# Patient Record
Sex: Female | Born: 1950 | Race: White | Hispanic: No | Marital: Married | Smoking: Former smoker
Health system: Southern US, Community
[De-identification: ages and names within clinical notes are randomized; demographics above are authoritative.]

## PROBLEM LIST (undated history)

## (undated) DIAGNOSIS — I10 Essential (primary) hypertension: Secondary | ICD-10-CM

## (undated) DIAGNOSIS — B9789 Other viral agents as the cause of diseases classified elsewhere: Secondary | ICD-10-CM

## (undated) DIAGNOSIS — R002 Palpitations: Secondary | ICD-10-CM

## (undated) DIAGNOSIS — F17200 Nicotine dependence, unspecified, uncomplicated: Secondary | ICD-10-CM

## (undated) HISTORY — DX: Other viral agents as the cause of diseases classified elsewhere: B97.89

## (undated) HISTORY — PX: CHOLECYSTECTOMY: SHX55

## (undated) HISTORY — DX: Nicotine dependence, unspecified, uncomplicated: F17.200

## (undated) HISTORY — DX: Palpitations: R00.2

## (undated) HISTORY — DX: Essential (primary) hypertension: I10

## (undated) HISTORY — PX: ABDOMINAL HYSTERECTOMY: SHX81

## (undated) HISTORY — PX: POLYPECTOMY: SHX149

## (undated) HISTORY — PX: VEIN LIGATION AND STRIPPING: SHX2653

## (undated) HISTORY — PX: TUBAL LIGATION: SHX77

## (undated) HISTORY — PX: COLONOSCOPY: SHX174

---

## 2002-06-26 ENCOUNTER — Other Ambulatory Visit: Admission: RE | Admit: 2002-06-26 | Discharge: 2002-06-26 | Payer: Self-pay | Admitting: Internal Medicine

## 2003-11-05 ENCOUNTER — Emergency Department (HOSPITAL_COMMUNITY): Admission: EM | Admit: 2003-11-05 | Discharge: 2003-11-05 | Payer: Self-pay | Admitting: Emergency Medicine

## 2004-01-01 ENCOUNTER — Encounter (INDEPENDENT_AMBULATORY_CARE_PROVIDER_SITE_OTHER): Payer: Self-pay | Admitting: Specialist

## 2004-01-01 ENCOUNTER — Ambulatory Visit (HOSPITAL_COMMUNITY): Admission: RE | Admit: 2004-01-01 | Discharge: 2004-01-01 | Payer: Self-pay | Admitting: Surgery

## 2005-05-25 ENCOUNTER — Ambulatory Visit: Payer: Self-pay | Admitting: Internal Medicine

## 2005-06-14 ENCOUNTER — Ambulatory Visit: Payer: Self-pay | Admitting: Internal Medicine

## 2006-06-02 ENCOUNTER — Ambulatory Visit: Payer: Self-pay | Admitting: Internal Medicine

## 2006-06-21 ENCOUNTER — Ambulatory Visit: Payer: Self-pay | Admitting: Internal Medicine

## 2007-03-01 LAB — HM COLONOSCOPY

## 2007-08-21 ENCOUNTER — Ambulatory Visit: Payer: Self-pay | Admitting: Family Medicine

## 2008-02-26 DIAGNOSIS — I10 Essential (primary) hypertension: Secondary | ICD-10-CM

## 2008-02-26 DIAGNOSIS — Z87891 Personal history of nicotine dependence: Secondary | ICD-10-CM

## 2008-02-27 ENCOUNTER — Ambulatory Visit: Payer: Self-pay | Admitting: Family Medicine

## 2008-03-04 ENCOUNTER — Ambulatory Visit: Payer: Self-pay | Admitting: Family Medicine

## 2008-03-10 DIAGNOSIS — E785 Hyperlipidemia, unspecified: Secondary | ICD-10-CM

## 2008-03-10 LAB — CONVERTED CEMR LAB
ALT: 11 units/L (ref 0–35)
AST: 21 units/L (ref 0–37)
Albumin: 4.1 g/dL (ref 3.5–5.2)
Alkaline Phosphatase: 70 units/L (ref 39–117)
BUN: 8 mg/dL (ref 6–23)
Bilirubin, Direct: 0.1 mg/dL (ref 0.0–0.3)
CO2: 31 meq/L (ref 19–32)
Calcium: 10 mg/dL (ref 8.4–10.5)
Chloride: 99 meq/L (ref 96–112)
Cholesterol: 233 mg/dL (ref 0–200)
Creatinine, Ser: 1 mg/dL (ref 0.4–1.2)
Direct LDL: 162.1 mg/dL
GFR calc Af Amer: 73 mL/min
GFR calc non Af Amer: 60 mL/min
Glucose, Bld: 96 mg/dL (ref 70–99)
HDL: 32.6 mg/dL — ABNORMAL LOW (ref >39.0)
Potassium: 3.6 meq/L (ref 3.5–5.1)
Sodium: 137 meq/L (ref 135–145)
Total Bilirubin: 1.1 mg/dL (ref 0.3–1.2)
Total CHOL/HDL Ratio: 7.1
Total Protein: 7.1 g/dL (ref 6.0–8.3)
Triglycerides: 191 mg/dL — ABNORMAL HIGH (ref 0–149)
VLDL: 38 mg/dL (ref 0–40)

## 2008-05-01 ENCOUNTER — Encounter: Payer: Self-pay | Admitting: Family Medicine

## 2008-05-01 ENCOUNTER — Ambulatory Visit: Payer: Self-pay | Admitting: Family Medicine

## 2008-05-02 ENCOUNTER — Encounter (INDEPENDENT_AMBULATORY_CARE_PROVIDER_SITE_OTHER): Payer: Self-pay | Admitting: *Deleted

## 2008-07-01 ENCOUNTER — Ambulatory Visit: Payer: Self-pay | Admitting: Family Medicine

## 2008-07-04 LAB — CONVERTED CEMR LAB
Cholesterol: 199 mg/dL (ref 0–200)
Direct LDL: 124.2 mg/dL
HDL: 28.3 mg/dL — ABNORMAL LOW (ref >39.0)
Total CHOL/HDL Ratio: 7
Triglycerides: 242 mg/dL (ref 0–149)
VLDL: 48 mg/dL — ABNORMAL HIGH (ref 0–40)

## 2009-01-05 ENCOUNTER — Encounter (INDEPENDENT_AMBULATORY_CARE_PROVIDER_SITE_OTHER): Payer: Self-pay | Admitting: *Deleted

## 2009-05-27 ENCOUNTER — Ambulatory Visit: Payer: Self-pay | Admitting: Family Medicine

## 2009-05-27 LAB — CONVERTED CEMR LAB
Cholesterol, target level: 200 mg/dL
HDL goal, serum: 40 mg/dL
LDL Goal: 130 mg/dL

## 2009-05-29 ENCOUNTER — Encounter (INDEPENDENT_AMBULATORY_CARE_PROVIDER_SITE_OTHER): Payer: Self-pay | Admitting: *Deleted

## 2009-05-29 LAB — CONVERTED CEMR LAB
ALT: 12 units/L (ref 0–35)
AST: 21 units/L (ref 0–37)
Albumin: 4.1 g/dL (ref 3.5–5.2)
Alkaline Phosphatase: 65 units/L (ref 39–117)
BUN: 13 mg/dL (ref 6–23)
Bilirubin, Direct: 0.1 mg/dL (ref 0.0–0.3)
CO2: 32 meq/L (ref 19–32)
Calcium: 9.7 mg/dL (ref 8.4–10.5)
Chloride: 105 meq/L (ref 96–112)
Cholesterol: 204 mg/dL — ABNORMAL HIGH (ref 0–200)
Creatinine, Ser: 0.8 mg/dL (ref 0.4–1.2)
Direct LDL: 149.1 mg/dL
GFR calc non Af Amer: 77.66 mL/min (ref >60)
Glucose, Bld: 93 mg/dL (ref 70–99)
HDL: 34.3 mg/dL — ABNORMAL LOW (ref >39.00)
Potassium: 3.5 meq/L (ref 3.5–5.1)
Sodium: 140 meq/L (ref 135–145)
Total Bilirubin: 1 mg/dL (ref 0.3–1.2)
Total CHOL/HDL Ratio: 6
Total Protein: 7 g/dL (ref 6.0–8.3)
Triglycerides: 171 mg/dL — ABNORMAL HIGH (ref 0.0–149.0)
VLDL: 34.2 mg/dL (ref 0.0–40.0)

## 2009-06-23 ENCOUNTER — Encounter (INDEPENDENT_AMBULATORY_CARE_PROVIDER_SITE_OTHER): Payer: Self-pay | Admitting: *Deleted

## 2009-08-23 ENCOUNTER — Emergency Department (HOSPITAL_COMMUNITY): Admission: EM | Admit: 2009-08-23 | Discharge: 2009-08-23 | Payer: Self-pay | Admitting: Emergency Medicine

## 2009-09-11 ENCOUNTER — Ambulatory Visit: Payer: Self-pay | Admitting: Family Medicine

## 2009-09-15 LAB — CONVERTED CEMR LAB
ALT: 10 units/L (ref 0–35)
AST: 17 units/L (ref 0–37)
Cholesterol: 206 mg/dL — ABNORMAL HIGH (ref 0–200)
Direct LDL: 134 mg/dL
HDL: 31.7 mg/dL — ABNORMAL LOW (ref >39.00)
Total CHOL/HDL Ratio: 6
Triglycerides: 239 mg/dL — ABNORMAL HIGH (ref 0.0–149.0)
VLDL: 47.8 mg/dL — ABNORMAL HIGH (ref 0.0–40.0)

## 2009-09-16 ENCOUNTER — Ambulatory Visit: Payer: Self-pay | Admitting: Family Medicine

## 2010-12-02 ENCOUNTER — Telehealth (INDEPENDENT_AMBULATORY_CARE_PROVIDER_SITE_OTHER): Payer: Self-pay | Admitting: *Deleted

## 2010-12-06 ENCOUNTER — Ambulatory Visit: Payer: Self-pay | Admitting: Family Medicine

## 2010-12-07 LAB — CONVERTED CEMR LAB
ALT: 11 units/L (ref 0–35)
AST: 20 units/L (ref 0–37)
Albumin: 4.1 g/dL (ref 3.5–5.2)
Alkaline Phosphatase: 69 units/L (ref 39–117)
BUN: 12 mg/dL (ref 6–23)
Basophils Absolute: 0.1 10*3/uL (ref 0.0–0.1)
Basophils Relative: 1.1 % (ref 0.0–3.0)
Bilirubin, Direct: 0 mg/dL (ref 0.0–0.3)
CO2: 31 meq/L (ref 19–32)
Calcium: 9.8 mg/dL (ref 8.4–10.5)
Chloride: 95 meq/L — ABNORMAL LOW (ref 96–112)
Cholesterol: 221 mg/dL — ABNORMAL HIGH (ref 0–200)
Creatinine, Ser: 0.8 mg/dL (ref 0.4–1.2)
Direct LDL: 142.4 mg/dL
Eosinophils Absolute: 0.4 10*3/uL (ref 0.0–0.7)
Eosinophils Relative: 5.8 % — ABNORMAL HIGH (ref 0.0–5.0)
GFR calc non Af Amer: 81.98 mL/min (ref >60.00)
Glucose, Bld: 113 mg/dL — ABNORMAL HIGH (ref 70–99)
HCT: 42.6 % (ref 36.0–46.0)
HDL: 31 mg/dL — ABNORMAL LOW (ref >39.00)
Hemoglobin: 14.6 g/dL (ref 12.0–15.0)
Lymphocytes Relative: 29.7 % (ref 12.0–46.0)
Lymphs Abs: 2.2 10*3/uL (ref 0.7–4.0)
MCHC: 34.4 g/dL (ref 30.0–36.0)
MCV: 87.5 fL (ref 78.0–100.0)
Monocytes Absolute: 0.5 10*3/uL (ref 0.1–1.0)
Monocytes Relative: 7.1 % (ref 3.0–12.0)
Neutro Abs: 4.1 10*3/uL (ref 1.4–7.7)
Neutrophils Relative %: 56.3 % (ref 43.0–77.0)
Platelets: 333 10*3/uL (ref 150.0–400.0)
Potassium: 4.1 meq/L (ref 3.5–5.1)
RBC: 4.87 M/uL (ref 3.87–5.11)
RDW: 13.6 % (ref 11.5–14.6)
Sodium: 134 meq/L — ABNORMAL LOW (ref 135–145)
TSH: 0.76 microintl units/mL (ref 0.35–5.50)
Total Bilirubin: 0.5 mg/dL (ref 0.3–1.2)
Total CHOL/HDL Ratio: 7
Total Protein: 6.7 g/dL (ref 6.0–8.3)
Triglycerides: 301 mg/dL — ABNORMAL HIGH (ref 0.0–149.0)
VLDL: 60.2 mg/dL — ABNORMAL HIGH (ref 0.0–40.0)
Vitamin B-12: 457 pg/mL (ref 211–911)
WBC: 7.3 10*3/uL (ref 4.5–10.5)

## 2010-12-10 ENCOUNTER — Ambulatory Visit: Payer: Self-pay | Admitting: Family Medicine

## 2010-12-28 ENCOUNTER — Ambulatory Visit
Admission: RE | Admit: 2010-12-28 | Discharge: 2010-12-28 | Payer: Self-pay | Source: Home / Self Care | Attending: Family Medicine | Admitting: Family Medicine

## 2010-12-28 ENCOUNTER — Encounter: Payer: Self-pay | Admitting: Family Medicine

## 2010-12-28 DIAGNOSIS — R7303 Prediabetes: Secondary | ICD-10-CM | POA: Insufficient documentation

## 2010-12-28 DIAGNOSIS — M8580 Other specified disorders of bone density and structure, unspecified site: Secondary | ICD-10-CM | POA: Insufficient documentation

## 2010-12-28 LAB — HM PAP SMEAR

## 2010-12-28 LAB — CONVERTED CEMR LAB

## 2011-01-26 ENCOUNTER — Ambulatory Visit: Payer: Self-pay | Admitting: Family Medicine

## 2011-01-26 ENCOUNTER — Encounter: Payer: Self-pay | Admitting: Family Medicine

## 2011-01-27 NOTE — Assessment & Plan Note (Signed)
Summary: CPX / LFW   Vital Signs:  Patient profile:   60 year old female Height:      63 inches Weight:      164.8 pounds BMI:     29.30 Temp:     97.8 degrees F oral Pulse rate:   64 / minute Pulse rhythm:   regular BP sitting:   112 / 78  (left arm) Cuff size:   regular  Vitals Entered By: Benny Lennert CMA Duncan Dull) (December 28, 2010 3:36 PM)  History of Present Illness: Chief complaint cpx  The patient is here for annual wellness exam and preventative care.   Doing well overall.      Hypertension History:      She denies headache, palpitations, dyspnea with exertion, syncope, and side effects from treatment.  Well controlled at home/pharm.        Positive major cardiovascular risk factors include female age 41 years old or older, hyperlipidemia, and hypertension.    Lipid Management History:      Positive NCEP/ATP III risk factors include female age 6 years old or older, HDL cholesterol less than 40, and hypertension.        The patient states that she knows about the "Therapeutic Lifestyle Change" diet.  Her compliance with the TLC diet is fair.      Preventive Screening-Counseling & Management  Caffeine-Diet-Exercise     Does Patient Exercise: no     Exercise Counseling: to improve exercise regimen  Problems Prior to Update: 1)  Other Malaise and Fatigue  (ICD-780.79) 2)  Dermatitis, Allergic  (ICD-692.9) 3)  Hyperlipidemia  (ICD-272.4) 4)  Special Screening For Malignant Neoplasms Colon  (ICD-V76.51) 5)  Unspecified Breast Screening  (ICD-V76.10) 6)  Well Woman  (ICD-V70.0) 7)  Routine Gynecological Examination  (ICD-V72.31) 8)  Tobacco Abuse  (ICD-305.1) 9)  Hypertension  (ICD-401.9)  Current Medications (verified): 1)  Tenoretic 100 100-25 Mg  Tabs (Atenolol-Chlorthalidone) .Marland Kitchen.. 1 By Mouth Once Daily 2)  Multivitamins   Tabs (Multiple Vitamin) .... Once Daily 3)  Bl Vitamin E 400 Unit  Caps (Vitamin E) .... Once Daily 4)  Caltrate 600+d 600-400  Mg-Unit  Tabs (Calcium Carbonate-Vitamin D) .... Once Daily 5)  Fish Oil  Oil (Fish Oil) .Marland Kitchen.. 1000 Mg By Mouth Two Times A Day  Allergies (verified): No Known Drug Allergies  Past History:  Past medical, surgical, family and social histories (including risk factors) reviewed, and no changes noted (except as noted below).  Past Medical History: Reviewed history from 02/26/2008 and no changes required. PALPITATIONS (ICD-785.1) TOBACCO ABUSE (ICD-305.1) HYPERTENSION (ICD-401.9) VIRAL INFECTION (ICD-079.99)    Past Surgical History: Reviewed history from 02/27/2008 and no changes required.             Vein stripping              BTL 1995     Abdominal hysterectomy (bleeding) ovaries intact 6/06      Bone Density (-) 12/26/2004 cholecystectomy  Family History: Reviewed history from 02/27/2008 and no changes required. Father: Died 76. MI, emphysema Mother: Alive 47, HTN, osteoporosis Siblings: 8 siblings Diabetes:  Son CA:  2 maternal aunts  Social History: Reviewed history from 02/27/2008 and no changes required. Marital Status: Married x 40 years Children: G3, P2, ages 60 and 21 Occupation: Advertising account planner Does Patient Exercise:  no  Review of Systems General:  Denies fatigue and fever. CV:  Denies chest pain or discomfort. Resp:  Denies shortness of breath. GI:  Denies abdominal pain, bloody stools, change in bowel habits, constipation, and diarrhea. GU:  Denies abnormal vaginal bleeding and dysuria. Derm:  Denies rash. Psych:  Denies anxiety and depression.  Physical Exam  General:  Well-developed,well-nourished,in no acute distress; alert,appropriate and cooperative throughout examination Eyes:  No corneal or conjunctival inflammation noted. EOMI. Perrla. Funduscopic exam benign, without hemorrhages, exudates or papilledema. Vision grossly normal. Ears:  External ear exam shows no significant lesions or deformities.  Otoscopic examination reveals clear canals,  tympanic membranes are intact bilaterally without bulging, retraction, inflammation or discharge. Hearing is grossly normal bilaterally. Nose:  External nasal examination shows no deformity or inflammation. Nasal mucosa are pink and moist without lesions or exudates. Mouth:  Oral mucosa and oropharynx without lesions or exudates.  Teeth in good repair. Neck:  no carotid bruit or thyromegaly no cervical or supraclavicular lymphadenopathy  Lungs:  Normal respiratory effort, chest expands symmetrically. Lungs are clear to auscultation, no crackles or wheezes. Heart:  Normal rate and regular rhythm. S1 and S2 normal without gallop, murmur, click, rub or other extra sounds. Abdomen:  Bowel sounds positive,abdomen soft and non-tender without masses, organomegaly or hernias noted. Genitalia:  Pelvic Exam:        External: normal female genitalia without lesions or masses        Vagina: external nml        Cervix: none         Adnexa: normal bimanual exam without masses or fullness        Uterus: none         Pap smear: not performed Pulses:  R and L posterior tibial pulses are full and equal bilaterally  Extremities:  no edema  Neurologic:  No cranial nerve deficits noted. Station and gait are normal. Plantar reflexes are down-going bilaterally. DTRs are symmetrical throughout. Sensory, motor and coordinative functions appear intact. Skin:  Intact without suspicious lesions or rashes Psych:  Cognition and judgment appear intact. Alert and cooperative with normal attention span and concentration. No apparent delusions, illusions, hallucinations   Impression & Recommendations:  Problem # 1:  WELL WOMAN (ICD-V70.0) The patient's preventative maintenance and recommended screening tests for an annual wellness exam were reviewed in full today. Brought up to date unless services declined.  Counselled on the importance of diet, exercise, and its role in overall health and mortality. The patient's FH and  SH was reviewed, including their home life, tobacco status, and drug and alcohol status.     Problem # 2:  ROUTINE GYNECOLOGICAL EXAMINATION (ICD-V72.31) DVE no pap.  Problem # 3:  HYPERLIPIDEMIA (ICD-272.4) Poor control.. work on lifestyle change. Add fish oil 2000 mg daily. Recheck in 3 months. Consider medication if not at goal.   Problem # 4:  PREDIABETES (ICD-790.29) Assessment: New Work on lowering carbs in diet... decrease sweets. Increase fiber and exercise.  Labs Reviewed: Creat: 0.8 (12/06/2010)     Problem # 5:  HYPERTENSION (ICD-401.9) Well controlled. Continue current medication.  Her updated medication list for this problem includes:    Tenoretic 100 100-25 Mg Tabs (Atenolol-chlorthalidone) .Marland Kitchen... 1 by mouth once daily  Problem # 6:  TOBACCO ABUSE (ICD-305.1)  precontemplative.  PFTs today were normal for COPD screening.   Orders: Spirometry w/Graph (94010)  Complete Medication List: 1)  Tenoretic 100 100-25 Mg Tabs (Atenolol-chlorthalidone) .Marland Kitchen.. 1 by mouth once daily 2)  Multivitamins Tabs (Multiple vitamin) .... Once daily 3)  Bl Vitamin E 400 Unit Caps (Vitamin e) .... Once daily 4)  Caltrate 600+d 600-400 Mg-unit Tabs (Calcium carbonate-vitamin d) .... Once daily 5)  Fish Oil Oil (Fish oil) .Marland Kitchen.. 1000 mg by mouth two times a day  Other Orders: Radiology Referral (Radiology) Radiology Referral (Radiology)  Hypertension Assessment/Plan:      The patient's hypertensive risk group is category B: At least one risk factor (excluding diabetes) with no target organ damage.  Today's blood pressure is 112/78.  Her blood pressure goal is < 140/90.  Lipid Assessment/Plan:      Based on NCEP/ATP III, the patient's risk factor category is "2 or more risk factors and a calculated 10 year CAD risk of > 20%".  The patient's lipid goals are as follows: Total cholesterol goal is 200; LDL cholesterol goal is 130; HDL cholesterol goal is 40; Triglyceride goal is 150.  Her LDL  cholesterol goal has not been met.    Patient Instructions: 1)  Increase exercsie.. walking 3-5 times a week. 2)   Decrease carbohydrates, sweets in diet. 3)   Start fish oil 2000 mg divided daily.  4)  Recheck fasting LIPIDS,  glucose, AST, ALT  in 3 months Dx 272.0  5)  Call to set up nurse visit appt if shingles covered by insurance or if interested in tetanus.  6)  Referral Appointment Information 7)  Day/Date: 8)  Time: 9)  Place/MD: 10)  Address: 11)  Phone/Fax: 12)  Patient given appointment information. Information/Orders faxed/mailed.  13)       Orders Added: 1)  Spirometry w/Graph [94010] 2)  Radiology Referral [Radiology] 3)  Radiology Referral [Radiology] 4)  Est. Patient 40-64 years [99396] 5)  Est. Patient Level III [[address]]    Current Allergies (reviewed today): No known allergies   Flu Vaccine Next Due:  Refused Last TD:  Td (12/26/1993 4:44:56 PM) TD Next Due:  Refused Flex Sig Next Due:  Not Indicated Last Colonoscopy:  abnormal (02/28/2002 3:36:37 PM) Colonoscopy Next Due:  10 yr Hemoccult Next Due:  Not Indicated PAP Result Date:  12/28/2010 PAP Result:  DVE no pap. PAP Next Due:  1 yr

## 2011-01-27 NOTE — Progress Notes (Signed)
----   Converted from flag ---- ---- 12/01/2010 11:09 AM, Kerby Nora MD wrote: Dx 272.0 CMET, lipids, cbc, TSH B12 Dx 780.79  ---- 11/30/2010 12:22 PM, Liane Comber CMA (AAMA) wrote: Lab orders please! Good Morning! This pt is scheduled for cpx labs Monday, which labs to draw and dx codes to use? Thanks Tasha ------------------------------

## 2011-01-28 LAB — HM MAMMOGRAPHY: HM Mammogram: NORMAL

## 2011-04-06 ENCOUNTER — Other Ambulatory Visit: Payer: Self-pay | Admitting: Family Medicine

## 2011-04-06 DIAGNOSIS — E7800 Pure hypercholesterolemia, unspecified: Secondary | ICD-10-CM

## 2011-04-11 ENCOUNTER — Other Ambulatory Visit: Payer: Self-pay

## 2011-05-13 NOTE — Op Note (Signed)
NAMEMarland Kitchen  Mcguire, Jade                          ACCOUNT NO.:  [id number]   MEDICAL RECORD NO.:  [id number]                   PATIENT TYPE:  AMB   LOCATION:  DAY                                  FACILITY:  [facility]   PHYSICIAN:  Currie Paris, M.D.           DATE OF BIRTH:  01/25/1951   DATE OF PROCEDURE:  01/01/2004  DATE OF DISCHARGE:                                 OPERATIVE REPORT   OFFICE MEDICAL RECORD NUMBER:  [id number]   PREOPERATIVE DIAGNOSIS:  Chronic calculus cholecystitis.   POSTOPERATIVE DIAGNOSIS:  Chronic calculus cholecystitis.   OPERATION:  Laparoscopic cholecystectomy with operative cholangiogram.   SURGEON:  Currie Paris, M.D.   ASSISTANT:  Dr. Maple Hudson.   ANESTHESIA:  General endotracheal.   CLINICAL HISTORY:  This patient is a 60 year old, who has been having  biliary symptoms and finding of gallstones on evaluation.  She was scheduled  for cholecystectomy.   DESCRIPTION OF PROCEDURE:  The patient was seen in the holding area, had no  further questions.  She was taken to the operating room and after  satisfactory general endotracheal anesthesia had been obtained, the abdomen  was prepped and draped.  Marcaine 0.25% was used for each incision. The  umbilical incision was made first, the fascia picked up and opened under  direct vision, and the peritoneal cavity entered.  Pursestring suture was  placed, the Hasson introduced, and the abdomen insufflated with 15.  Initial  exam of the abdomen showed no gross abnormalities.  The patient was placed  in reverse Trendelenburg and tilted to the left.  A 10-11 trocar was placed  under direct vision in the epigastrium and two 5 mm placed under direct  vision in the right upper quadrant.   The gallbladder was retracted over the liver, the peritoneum opened so that  I could identify the cystic duct and the common duct and the junction cystic  of both as well as identifying the cystic artery.  I made a fairly  good-  sized window in the triangle to make sure that no other structures.  The  cystic duct was relatively short but clearly identified and the common duct  was well-visualized as well.   The cystic duct was clipped at its junction with the gallbladder and opened.  A Cook catheter was introduced percutaneously and placed in the cystic duct  and held with a clip.  Operative cholangiogram showed good filling of the  common duct and hepatic radicles with good filling into the duodenum and no  evidence of obstruction or stones.   The catheter was removed and three clips placed on the stay side of the  cystic duct.  It was divided.  The cystic artery was dissected out, clipped,  and divided, and this was the anterior branch.  A little further dissection  revealed the posterior branch which was likewise clipped and divided.  The  gallbladder was removed from  below to above with coagulation current of the  cautery.  Just prior to disconnecting and just after disconnecting it, we  checked and made sure the bed of the gallbladder was dry.   The gallbladder was brought out the umbilical port without difficulty.  The  port was occluded for a few seconds while the abdomen was reinsufflated and  a final check made for hemostasis and, again, everything appeared to be dry.  The lateral ports were removed under direct vision, and there was no  bleeding.  The abdomen was deflated through the epigastric port.  The skin  was closed with 4-0 Monocryl subcuticular and Dermabond.   The patient tolerated the procedure well.  There were no operative  complications, and all counts were correct.                                               Currie Paris, M.D.    CJS/MEDQ  D:  01/01/2004  T:  01/01/2004  Job:  [id number]   cc:   Rosalyn Gess. Norins, M.D. [facility]

## 2011-08-15 ENCOUNTER — Telehealth: Payer: Self-pay | Admitting: *Deleted

## 2011-08-15 NOTE — Telephone Encounter (Signed)
Pt would like to get a Tdap, since she has a new grand baby.  She wants an order that she can take to [facility].  Please call when ready.

## 2011-08-18 NOTE — Telephone Encounter (Signed)
Order in outbox.

## 2011-08-18 NOTE — Telephone Encounter (Signed)
Message left for patient rx ready for pick up

## 2011-08-30 ENCOUNTER — Ambulatory Visit (INDEPENDENT_AMBULATORY_CARE_PROVIDER_SITE_OTHER): Payer: 59 | Admitting: *Deleted

## 2011-08-30 DIAGNOSIS — Z23 Encounter for immunization: Secondary | ICD-10-CM

## 2012-02-28 ENCOUNTER — Other Ambulatory Visit: Payer: Self-pay | Admitting: *Deleted

## 2012-02-28 MED ORDER — ATENOLOL-CHLORTHALIDONE 100-25 MG PO TABS: 1.0000 | ORAL_TABLET | Freq: Every day | ORAL | Status: DC

## 2012-02-28 NOTE — Telephone Encounter (Signed)
Patient called stating that she has scheduled an appointment on 03/01/12 with Dr. Patsy Lager.

## 2012-02-28 NOTE — Telephone Encounter (Signed)
[place] 

## 2012-02-28 NOTE — Telephone Encounter (Signed)
Patient not seen since 2011 

## 2012-03-01 ENCOUNTER — Ambulatory Visit (INDEPENDENT_AMBULATORY_CARE_PROVIDER_SITE_OTHER): Payer: 59 | Admitting: Family Medicine

## 2012-03-01 ENCOUNTER — Encounter: Payer: Self-pay | Admitting: Family Medicine

## 2012-03-01 VITALS — BP 160/102 | HR 78 | Temp 97.6°F | Ht 63.0 in | Wt 161.1 lb

## 2012-03-01 DIAGNOSIS — I10 Essential (primary) hypertension: Secondary | ICD-10-CM

## 2012-03-01 MED ORDER — ATENOLOL-CHLORTHALIDONE 100-25 MG PO TABS: 1.0000 | ORAL_TABLET | Freq: Every day | ORAL | Status: DC

## 2012-03-01 NOTE — Progress Notes (Signed)
  Patient Name: Jade Mcguire Date of Birth: October 24, 1951 Age: 61 y.o. Medical Record Number: [id number] Gender: female Date of Encounter: 03/01/2012  History of Present Illness:  Jade Mcguire is a 61 y.o. very pleasant female patient who presents with the following:  HTN: feeling HA and some anxiety  HTN: Tolerating all medications without side effects --- but now off of it Stable and at goal No CP, no sob. No HA.  BP Readings from Last 3 Encounters:  03/01/12 160/102  12/28/10 112/78  09/16/09 116/76    Basic Metabolic Panel:    Component Value Date/Time   NA 134* 12/06/2010 1050   K 4.1 12/06/2010 1050   CL 95* 12/06/2010 1050   CO2 31 12/06/2010 1050   BUN 12 12/06/2010 1050   CREATININE 0.8 12/06/2010 1050   GLUCOSE 113* 12/06/2010 1050   CALCIUM 9.8 12/06/2010 1050      Past Medical History, Surgical History, Social History, Family History, Problem List, Medications, and Allergies have been reviewed and updated if relevant.  Review of Systems:  GEN: No acute illnesses, no fevers, chills. Pulm: No SOB Interactive and getting along well at home.  Otherwise, ROS is as per the HPI.   Physical Examination: Filed Vitals:   03/01/12 1110  BP: 160/102  Pulse: 78  Temp: 97.6 F (36.4 C)  TempSrc: Oral  Height: 5\' 3"  (1.6 m)  Weight: 161 lb 1.9 oz (73.084 kg)  SpO2: 100%    Body mass index is 28.54 kg/(m^2).   GEN: WDWN, NAD, Non-toxic, A & O x 3 HEENT: Atraumatic, Normocephalic. Neck supple. No masses, No LAD. Ears and Nose: No external deformity. CV: RRR, No M/G/R. No JVD. No thrill. No extra heart sounds. PULM: CTA B, no wheezes, crackles, rhonchi. No retractions. No resp. distress. No accessory muscle use. EXTR: No c/c/e NEURO Normal gait.  PSYCH: Normally interactive. Conversant. Not depressed or anxious appearing.  Calm demeanor.    Assessment and Plan: 1. Unspecified essential hypertension  atenolol-chlorthalidone (TENORETIC) 100-25 MG per  tablet    Refilled, f/u for cpx in summer with Dr. Leonard Schwartz

## 2012-04-04 ENCOUNTER — Encounter: Payer: 59 | Admitting: Family Medicine

## 2012-06-20 ENCOUNTER — Encounter: Payer: Self-pay | Admitting: Gastroenterology

## 2012-09-26 ENCOUNTER — Encounter: Payer: Self-pay | Admitting: Gastroenterology

## 2012-10-29 ENCOUNTER — Ambulatory Visit (AMBULATORY_SURGERY_CENTER): Payer: 59 | Admitting: *Deleted

## 2012-10-29 ENCOUNTER — Encounter: Payer: Self-pay | Admitting: Gastroenterology

## 2012-10-29 VITALS — Ht 63.0 in | Wt 174.0 lb

## 2012-10-29 DIAGNOSIS — Z1211 Encounter for screening for malignant neoplasm of colon: Secondary | ICD-10-CM

## 2012-10-29 MED ORDER — SUPREP BOWEL PREP KIT 17.5-3.13-1.6 GM/177ML PO SOLN: ORAL | Status: DC

## 2012-10-29 NOTE — Progress Notes (Signed)
Patient states she gets "sick",vomiting after All surgeries with General Anesthesia. Pt denies any N&V after last colonoscopy with Moderate Sedation.

## 2012-11-13 ENCOUNTER — Encounter: Payer: Self-pay | Admitting: Gastroenterology

## 2012-11-13 ENCOUNTER — Ambulatory Visit (AMBULATORY_SURGERY_CENTER): Payer: 59 | Admitting: Gastroenterology

## 2012-11-13 ENCOUNTER — Other Ambulatory Visit: Payer: Self-pay | Admitting: Gastroenterology

## 2012-11-13 VITALS — BP 150/66 | HR 51 | Temp 96.8°F | Resp 17 | Ht 63.0 in | Wt 174.0 lb

## 2012-11-13 DIAGNOSIS — D126 Benign neoplasm of colon, unspecified: Secondary | ICD-10-CM

## 2012-11-13 DIAGNOSIS — Z1211 Encounter for screening for malignant neoplasm of colon: Secondary | ICD-10-CM

## 2012-11-13 MED ORDER — SODIUM CHLORIDE 0.9 % IV SOLN: 500.0000 mL | INTRAVENOUS | Status: DC

## 2012-11-13 NOTE — Patient Instructions (Addendum)
Handouts were given to your care partner on polyps.  Please hols aspirin, aspirin products and any anti-inflammatory medications for 2 weeks.  You may resume your other current medications today.  Please call if any questions or concerns.      YOU HAD AN ENDOSCOPIC PROCEDURE TODAY AT THE [facility]: Refer to the procedure report that was given to you for any specific questions about what was found during the examination.  If the procedure report does not answer your questions, please call your gastroenterologist to clarify.  If you requested that your care partner not be given the details of your procedure findings, then the procedure report has been included in a sealed envelope for you to review at your convenience later.  YOU SHOULD EXPECT: Some feelings of bloating in the abdomen. Passage of more gas than usual.  Walking can help get rid of the air that was put into your GI tract during the procedure and reduce the bloating. If you had a lower endoscopy (such as a colonoscopy or flexible sigmoidoscopy) you may notice spotting of blood in your stool or on the toilet paper. If you underwent a bowel prep for your procedure, then you may not have a normal bowel movement for a few days.  DIET: Your first meal following the procedure should be a light meal and then it is ok to progress to your normal diet.  A half-sandwich or bowl of soup is an example of a good first meal.  Heavy or fried foods are harder to digest and may make you feel nauseous or bloated.  Likewise meals heavy in dairy and vegetables can cause extra gas to form and this can also increase the bloating.  Drink plenty of fluids but you should avoid alcoholic beverages for 24 hours.  ACTIVITY: Your care partner should take you home directly after the procedure.  You should plan to take it easy, moving slowly for the rest of the day.  You can resume normal activity the day after the procedure however you should NOT DRIVE or use  heavy machinery for 24 hours (because of the sedation medicines used during the test).    SYMPTOMS TO REPORT IMMEDIATELY: A gastroenterologist can be reached at any hour.  During normal business hours, 8:30 AM to 5:00 PM Monday through Friday, call [phone].  After hours and on weekends, please call the GI answering service at [phone] who will take a message and have the physician on call contact you.   Following lower endoscopy (colonoscopy or flexible sigmoidoscopy):  Excessive amounts of blood in the stool  Significant tenderness or worsening of abdominal pains  Swelling of the abdomen that is new, acute  Fever of 100F or higher   FOLLOW UP: If any biopsies were taken you will be contacted by phone or by letter within the next 1-3 weeks.  Call your gastroenterologist if you have not heard about the biopsies in 3 weeks.  Our staff will call the home number listed on your records the next business day following your procedure to check on you and address any questions or concerns that you may have at that time regarding the information given to you following your procedure. This is a courtesy call and so if there is no answer at the home number and we have not heard from you through the emergency physician on call, we will assume that you have returned to your regular daily activities without incident.  SIGNATURES/CONFIDENTIALITY: You and/or your  care partner have signed paperwork which will be entered into your electronic medical record.  These signatures attest to the fact that that the information above on your After Visit Summary has been reviewed and is understood.  Full responsibility of the confidentiality of this discharge information lies with you and/or your care-partner.

## 2012-11-13 NOTE — Progress Notes (Signed)
No complaints noted in the recovery room. Maw  Patient did not experience any of the following events: a burn prior to discharge; a fall within the facility; wrong site/side/patient/procedure/implant event; or a hospital transfer or hospital admission upon discharge from the facility. (G8907) Patient did not have preoperative order for IV antibiotic SSI prophylaxis. (G8918)  

## 2012-11-13 NOTE — Op Note (Signed)
[facility] [address] [place], [address]   COLONOSCOPY PROCEDURE REPORT  PATIENT: Jade Mcguire, Jade Mcguire  MR#: [id number] BIRTHDATE: Apr 18, 1951 , 61  yrs. old GENDER: Female ENDOSCOPIST: Meryl Dare, MD, [facility] PROCEDURE DATE:  11/13/2012 PROCEDURE:   Colonoscopy with snare polypectomy ASA CLASS:   Class II INDICATIONS:average risk screening. MEDICATIONS: MAC sedation, administered by CRNA and propofol (Diprivan) 240mg  IV DESCRIPTION OF PROCEDURE:   After the risks benefits and alternatives of the procedure were thoroughly explained, informed consent was obtained.  A digital rectal exam revealed no abnormalities of the rectum.   The LB [id number]  endoscope was introduced through the anus and advanced to the cecum, which was identified by both the appendix and ileocecal valve. No adverse events experienced.   The quality of the prep was good, using MoviPrep  The instrument was then slowly withdrawn as the colon was fully examined.  COLON FINDINGS: A sessile polyp measuring 8 mm in size was found in the transverse colon.  A polypectomy was performed using snare cautery.  The resection was complete and the polyp tissue was completely retrieved.   Two sessile polyps measuring 5-6 mm in size were found in the sigmoid colon.  A polypectomy was performed with a cold snare.  The resection was complete and the polyp tissue was completely retrieved.   The colon was otherwise normal.  There was no diverticulosis, inflammation, polyps or cancers unless previously stated.  Retroflexed views revealed no abnormalities. The time to cecum=3 minutes 46 seconds.  Withdrawal time=10 minutes 32 seconds.  The scope was withdrawn and the procedure completed. COMPLICATIONS: There were no complications.  ENDOSCOPIC IMPRESSION: 1.   Sessile polyp measuring 8 mm in the transverse colon; polypectomy performed using snare cautery 2.   Two sessile polyps measuring 5-6 mm in the  sigmoid colon; polypectomy performed with a cold snare 3.   The colon was otherwise normal  RECOMMENDATIONS: 1.  Hold aspirin, aspirin products, and anti-inflammatory medication for 2 weeks. 2.  Await pathology results 3.  Repeat colonoscopy in 5 years if polyp adenomatous; otherwise 10 years   eSigned:  Meryl Dare, MD, [facility] 11/13/2012 11:16 AM

## 2012-11-13 NOTE — Progress Notes (Signed)
No egg or soy allergy per pt. ewm 

## 2012-11-14 ENCOUNTER — Telehealth: Payer: Self-pay | Admitting: *Deleted

## 2012-11-14 NOTE — Telephone Encounter (Signed)
  Follow up Call-  Call back number 11/13/2012  Post procedure Call Back phone  # [phone]  Permission to leave phone message Yes     Patient questions:  Do you have a fever, pain , or abdominal swelling? no Pain Score  0 *  Have you tolerated food without any problems? yes  Have you been able to return to your normal activities? yes  Do you have any questions about your discharge instructions: Diet   no Medications  no Follow up visit  no  Do you have questions or concerns about your Care? no  Actions: * If pain score is 4 or above: No action needed, pain <4.

## 2012-11-23 ENCOUNTER — Encounter: Payer: Self-pay | Admitting: Gastroenterology

## 2013-03-08 ENCOUNTER — Encounter: Payer: Self-pay | Admitting: Family Medicine

## 2013-03-08 ENCOUNTER — Ambulatory Visit (INDEPENDENT_AMBULATORY_CARE_PROVIDER_SITE_OTHER): Payer: 59 | Admitting: Family Medicine

## 2013-03-08 VITALS — BP 120/84 | HR 58 | Temp 98.0°F | Ht 63.0 in | Wt 180.0 lb

## 2013-03-08 DIAGNOSIS — R7309 Other abnormal glucose: Secondary | ICD-10-CM

## 2013-03-08 DIAGNOSIS — E785 Hyperlipidemia, unspecified: Secondary | ICD-10-CM

## 2013-03-08 DIAGNOSIS — M899 Disorder of bone, unspecified: Secondary | ICD-10-CM

## 2013-03-08 DIAGNOSIS — M8580 Other specified disorders of bone density and structure, unspecified site: Secondary | ICD-10-CM

## 2013-03-08 MED ORDER — ATENOLOL-CHLORTHALIDONE 100-25 MG PO TABS: 1.0000 | ORAL_TABLET | Freq: Every day | ORAL | Status: DC

## 2013-03-08 NOTE — Assessment & Plan Note (Signed)
Due for re-eval. 

## 2013-03-08 NOTE — Assessment & Plan Note (Signed)
Well controlled. Continue current medication.  

## 2013-03-08 NOTE — Progress Notes (Signed)
  Subjective:    Patient ID: Jade Mcguire, female    DOB: [birth year], 62 y.o.   MRN: [id number]  HPI  62 year old femal epresetns for follow up HTN and med refill.    Hypertension:  Well controlled on atenolol chlorthalidone.  Using medication without problems or lightheadedness: None Chest pain with exertion:None Edema:None Short of breath:None Average home BPs: not checking regularly Other issues: Quit smoking cigarrettes.   Wt Readings from Last 3 Encounters:  03/08/13 180 lb (81.647 kg)  11/13/12 174 lb (78.926 kg)  10/29/12 174 lb (78.926 kg)   No exercise, moderarate diet.    Review of Systems  Constitutional: Negative for fever and fatigue.  HENT: Negative for ear pain.   Eyes: Negative for pain.  Respiratory: Negative for chest tightness and shortness of breath.   Cardiovascular: Negative for chest pain, palpitations and leg swelling.  Gastrointestinal: Negative for abdominal pain.  Genitourinary: Negative for dysuria.       Objective:   Physical Exam  Constitutional: Vital signs are normal. She appears well-developed and well-nourished. She is cooperative.  Non-toxic appearance. She does not appear ill. No distress.  HENT:  Head: Normocephalic.  Right Ear: Hearing, tympanic membrane, external ear and ear canal normal. Tympanic membrane is not erythematous, not retracted and not bulging.  Left Ear: Hearing, tympanic membrane, external ear and ear canal normal. Tympanic membrane is not erythematous, not retracted and not bulging.  Nose: No mucosal edema or rhinorrhea. Right sinus exhibits no maxillary sinus tenderness and no frontal sinus tenderness. Left sinus exhibits no maxillary sinus tenderness and no frontal sinus tenderness.  Mouth/Throat: Uvula is midline, oropharynx is clear and moist and mucous membranes are normal.  Eyes: Conjunctivae, EOM and lids are normal. Pupils are equal, round, and reactive to light. No foreign bodies found.  Neck: Trachea  normal and normal range of motion. Neck supple. Carotid bruit is not present. No mass and no thyromegaly present.  Cardiovascular: Normal rate, regular rhythm, S1 normal, S2 normal, normal heart sounds, intact distal pulses and normal pulses.  Exam reveals no gallop and no friction rub.   No murmur heard. Pulmonary/Chest: Effort normal and breath sounds normal. Not tachypneic. No respiratory distress. She has no decreased breath sounds. She has no wheezes. She has no rhonchi. She has no rales.  Abdominal: Soft. Normal appearance and bowel sounds are normal. There is no tenderness.  Neurological: She is alert.  Skin: Skin is warm, dry and intact. No rash noted.  Psychiatric: Her speech is normal and behavior is normal. Judgment and thought content normal. Her mood appears not anxious. Cognition and memory are normal. She does not exhibit a depressed mood.          Assessment & Plan:

## 2013-03-08 NOTE — Patient Instructions (Addendum)
Schedule complete physical exam in next few months with fasting labs prior.   Get back on track with regular exercise and healthy eating.

## 2014-03-11 DIAGNOSIS — Z0279 Encounter for issue of other medical certificate: Secondary | ICD-10-CM

## 2014-03-18 ENCOUNTER — Other Ambulatory Visit: Payer: Self-pay | Admitting: Family Medicine

## 2014-04-08 ENCOUNTER — Other Ambulatory Visit: Payer: 59

## 2014-04-15 ENCOUNTER — Encounter: Payer: Self-pay | Admitting: Family Medicine

## 2014-04-15 ENCOUNTER — Ambulatory Visit (INDEPENDENT_AMBULATORY_CARE_PROVIDER_SITE_OTHER): Payer: Medicare Other | Admitting: Family Medicine

## 2014-04-15 VITALS — BP 120/80 | HR 54 | Temp 97.9°F | Ht 63.5 in | Wt 181.8 lb

## 2014-04-15 DIAGNOSIS — M949 Disorder of cartilage, unspecified: Secondary | ICD-10-CM

## 2014-04-15 DIAGNOSIS — Z1231 Encounter for screening mammogram for malignant neoplasm of breast: Secondary | ICD-10-CM

## 2014-04-15 DIAGNOSIS — M8580 Other specified disorders of bone density and structure, unspecified site: Secondary | ICD-10-CM

## 2014-04-15 DIAGNOSIS — E785 Hyperlipidemia, unspecified: Secondary | ICD-10-CM

## 2014-04-15 DIAGNOSIS — R7309 Other abnormal glucose: Secondary | ICD-10-CM

## 2014-04-15 DIAGNOSIS — I10 Essential (primary) hypertension: Secondary | ICD-10-CM

## 2014-04-15 DIAGNOSIS — M899 Disorder of bone, unspecified: Secondary | ICD-10-CM

## 2014-04-15 DIAGNOSIS — Z23 Encounter for immunization: Secondary | ICD-10-CM

## 2014-04-15 DIAGNOSIS — Z0000 Encounter for general adult medical examination without abnormal findings: Secondary | ICD-10-CM

## 2014-04-15 LAB — LIPID PANEL
Cholesterol: 230 mg/dL — ABNORMAL HIGH (ref 0–200)
HDL: 34.7 mg/dL — ABNORMAL LOW (ref >39.00)
LDL Cholesterol: 145 mg/dL — ABNORMAL HIGH (ref 0–99)
Total CHOL/HDL Ratio: 7
Triglycerides: 251 mg/dL — ABNORMAL HIGH (ref 0.0–149.0)
VLDL: 50.2 mg/dL — ABNORMAL HIGH (ref 0.0–40.0)

## 2014-04-15 LAB — COMPREHENSIVE METABOLIC PANEL
ALT: 12 U/L (ref 0–35)
AST: 20 U/L (ref 0–37)
Albumin: 4.2 g/dL (ref 3.5–5.2)
Alkaline Phosphatase: 68 U/L (ref 39–117)
BUN: 14 mg/dL (ref 6–23)
CO2: 32 mEq/L (ref 19–32)
Calcium: 10.3 mg/dL (ref 8.4–10.5)
Chloride: 95 mEq/L — ABNORMAL LOW (ref 96–112)
Creatinine, Ser: 1 mg/dL (ref 0.4–1.2)
GFR: 61.21 mL/min (ref >60.00)
Glucose, Bld: 92 mg/dL (ref 70–99)
Potassium: 3.5 mEq/L (ref 3.5–5.1)
Sodium: 137 mEq/L (ref 135–145)
Total Bilirubin: 0.8 mg/dL (ref 0.3–1.2)
Total Protein: 7.6 g/dL (ref 6.0–8.3)

## 2014-04-15 NOTE — Patient Instructions (Addendum)
Look into shingles vaccine coverage.  Stop at front desk to schedule mammogram and bone density. Stop at lab , we will call with results.  Call if dizzy, fatigued, follow heart rate sand BP at home.

## 2014-04-15 NOTE — Progress Notes (Signed)
Subjective:    Patient ID: Jade Mcguire, female    DOB: 1951-07-17, 63 y.o.   MRN: [id number]  HPI  WELCOME TO MEDICARE I have personally reviewed the Medicare Annual Wellness questionnaire and have noted 1. The patient's medical and social history 2. Their use of alcohol, tobacco or illicit drugs 3. Their current medications and supplements 4. The patient's functional ability including ADL's, fall risks, home safety risks and hearing or visual             impairment. 5. Diet and physical activities 6. Evidence for depression or mood disorders The patients weight, height, BMI and visual acuity have been recorded in the chart I have made referrals, counseling and provided education to the patient based review of the above and I have provided the pt with a written personalized care plan for preventive services.  Hypertension:  Well controlled on atenolol chlorthalidone BP Readings from Last 3 Encounters:  04/15/14 120/80  03/08/13 120/84  11/13/12 150/66  Using medication without problems or lightheadedness: None Chest pain with exertion:None Edema:None Short of breath:None Average home BPs: not checking Other issues:  Elevated Cholesterol:Due for re-eval. Lab Results  Component Value Date   CHOL 221* 12/06/2010   HDL 31.00* 12/06/2010   LDLDIRECT 142.4 12/06/2010   TRIG 301.0* 12/06/2010   CHOLHDL 7 12/06/2010  Diet compliance: Moderate Exercise: Walks 2-3 a week Other complaints:  Prediabetes: due for re-eval.  Review of Systems  Constitutional: Negative for fever and fatigue.  HENT: Negative for ear pain.   Eyes: Negative for pain.  Respiratory: Negative for chest tightness and shortness of breath.   Cardiovascular: Negative for chest pain, palpitations and leg swelling.  Gastrointestinal: Negative for abdominal pain.  Genitourinary: Negative for dysuria.       Objective:   Physical Exam  Constitutional: Vital signs are normal. She appears well-developed  and well-nourished. She is cooperative.  Non-toxic appearance. She does not appear ill. No distress.  HENT:  Head: Normocephalic.  Right Ear: Hearing, tympanic membrane, external ear and ear canal normal.  Left Ear: Hearing, tympanic membrane, external ear and ear canal normal.  Nose: Nose normal.  Eyes: Conjunctivae, EOM and lids are normal. Pupils are equal, round, and reactive to light. Lids are everted and swept, no foreign bodies found.  Neck: Trachea normal and normal range of motion. Neck supple. Carotid bruit is not present. No mass and no thyromegaly present.  Cardiovascular: Normal rate, regular rhythm, S1 normal, S2 normal, normal heart sounds and intact distal pulses.  Exam reveals no gallop.   No murmur heard. Pulmonary/Chest: Effort normal and breath sounds normal. No respiratory distress. She has no wheezes. She has no rhonchi. She has no rales.  Abdominal: Soft. Normal appearance and bowel sounds are normal. She exhibits no distension, no fluid wave, no abdominal bruit and no mass. There is no hepatosplenomegaly. There is no tenderness. There is no rebound, no guarding and no CVA tenderness. No hernia.  Genitourinary: Vagina normal. No breast swelling, tenderness, discharge or bleeding. Pelvic exam was performed with patient supine. There is no rash, tenderness or lesion on the right labia. There is no rash, tenderness or lesion on the left labia. Right adnexum displays no mass, no tenderness and no fullness. Left adnexum displays no mass, no tenderness and no fullness.  Lymphadenopathy:    She has no cervical adenopathy.    She has no axillary adenopathy.  Neurological: She is alert. She has normal strength. No cranial nerve deficit  or sensory deficit.  Skin: Skin is warm, dry and intact. No rash noted.  Psychiatric: Her speech is normal and behavior is normal. Judgment normal. Her mood appears not anxious. Cognition and memory are normal. She does not exhibit a depressed mood.           Assessment & Plan:  The patient's preventative maintenance and recommended screening tests for an annual wellness exam were reviewed in full today. Brought up to date unless services declined.  Counselled on the importance of diet, exercise, and its role in overall health and mortality. The patient's FH and SH was reviewed, including their home life, tobacco status, and drug and alcohol status.   Vaccines:Tdap uptodate, due for shingles and PNA Mammo:02/2012  DEXA:2012 osteopenia, mother with osteoporosis  Pap/DVE: partial hysterectomy for fibroids, plan DVE  Every other year. No fam hx ovarian Ca Colon:  Dr. Fuller Plan, repeat in 10 years Former smoker, 35 pack year hx. No daily cough, no SOB. Last spiro nml 2012  Wants to wait on CXR and spirometry at this time.

## 2014-04-15 NOTE — Assessment & Plan Note (Addendum)
Bradycardia likely from BBLocker, but pt asymptomatic.She does not wish to change her BP med at this time. She will follow pulse at home and call if fatigue or dizziness.

## 2014-04-15 NOTE — Assessment & Plan Note (Signed)
Due for re-eval. 

## 2014-04-15 NOTE — Progress Notes (Signed)
Pre visit review using our clinic review tool, if applicable. No additional management support is needed unless otherwise documented below in the visit note. 

## 2014-04-16 LAB — VITAMIN D 25 HYDROXY (VIT D DEFICIENCY, FRACTURES): VIT D 25 HYDROXY: 92 ng/mL — AB (ref 30–89)

## 2014-04-16 NOTE — Addendum Note (Signed)
Addended by: Carter Kitten on: 04/16/2014 10:47 AM   Modules accepted: Orders

## 2014-04-22 ENCOUNTER — Other Ambulatory Visit: Payer: Self-pay | Admitting: Family Medicine

## 2014-05-08 ENCOUNTER — Ambulatory Visit (INDEPENDENT_AMBULATORY_CARE_PROVIDER_SITE_OTHER): Payer: Medicare Other | Admitting: Family Medicine

## 2014-05-08 ENCOUNTER — Encounter: Payer: Self-pay | Admitting: Family Medicine

## 2014-05-08 VITALS — BP 134/88 | HR 57 | Temp 97.6°F | Ht 63.5 in | Wt 183.0 lb

## 2014-05-08 DIAGNOSIS — M1710 Unilateral primary osteoarthritis, unspecified knee: Secondary | ICD-10-CM

## 2014-05-08 DIAGNOSIS — M179 Osteoarthritis of knee, unspecified: Secondary | ICD-10-CM

## 2014-05-08 DIAGNOSIS — IMO0002 Reserved for concepts with insufficient information to code with codable children: Secondary | ICD-10-CM

## 2014-05-08 NOTE — Patient Instructions (Addendum)
Alleve 2 tabs by mouth two times a day over the counter: Take at least for 2 - 3 weeks. This is equal to a prescripton strength dose (GENERIC CHEAPER EQUIVALENT IS NAPROXEN SODIUM)    

## 2014-05-08 NOTE — Progress Notes (Signed)
Pre visit review using our clinic review tool, if applicable. No additional management support is needed unless otherwise documented below in the visit note. 

## 2014-05-08 NOTE — Progress Notes (Signed)
[place] [address] Phone: [phone] Fax: [phone]  Patient ID: JAYLYNE BREESE MRN: [id number], DOB: 05-25-1951, 63 y.o. Date of Encounter: 05/08/2014  Primary Physician:  Eliezer Lofts, MD   Chief Complaint: Swelling and Pain in Knees   Subjective:   History of Present Illness:  Jade Mcguire is a 63 y.o. pleasant patient who presents with the following:  The patient is having significant knee pain for about a past 2 years. She has been having some intermittent swelling and pain with flexion, and now her RIGHT pain is hurting more than the LEFT. Historically, the LEFT knee is her little bit more compared to the RIGHT. She has never had any specific trauma or instance of injury that she can recall. She is already taking some glucosamine, chondroitin, and omega-3 fatty acid's.  She has never had a traumatic fracture or no traumatic or operative intervention in the knee.  Patient Active Problem List   Diagnosis Date Noted  . OSTEOPENIA 12/28/2010  . PREDIABETES 12/28/2010  . HYPERLIPIDEMIA 03/10/2008  . Quit smoking 02/26/2008  . HYPERTENSION 02/26/2008    Past Medical History  Diagnosis Date  . Palpitations     pt denies any at this time.  . Tobacco use disorder   . Unspecified essential hypertension   . Unspecified viral infection, in conditions classified elsewhere and of unspecified site     Past Surgical History  Procedure Laterality Date  . Tubal ligation    . Abdominal hysterectomy    . Cholecystectomy    . Vein ligation and stripping    . Polypectomy    . Colonoscopy      last 2003    History   Social History  . Marital Status: Married    Spouse Name: N/A    Number of Children: 2  . Years of Education: N/A   Occupational History  . AGENT    Social History Main Topics  . Smoking status: Former Smoker    Types: Cigarettes  . Smokeless tobacco: Never Used     Comment: using electronic cigarettes-quit smoking in march 2013    . Alcohol Use: No  . Drug Use: No  . Sexual Activity: Not on file   Other Topics Concern  . Not on file   Social History Narrative   Reviewed 2015    Full code, no living will, no HCPOA.    Family History  Problem Relation Age of Onset  . Hypertension Mother   . Osteoporosis Mother   . Heart attack Father   . Emphysema Father   . Diabetes Son   . Cancer Maternal Aunt   . Colon cancer Neg Hx     No Known Allergies  Medication list has been reviewed and updated.  Review of Systems:  GEN: No fevers, chills. Nontoxic. Primarily MSK c/o today. MSK: Detailed in the HPI GI: tolerating PO intake without difficulty Neuro: No numbness, parasthesias, or tingling associated. Otherwise the pertinent positives of the ROS are noted above.   Objective:   Physical Examination: BP 134/88  Pulse 57  Temp(Src) 97.6 F (36.4 C) (Oral)  Ht 5' 3.5" (1.613 m)  Wt 183 lb (83.008 kg)  BMI 31.90 kg/m2  Ideal Body Weight: Weight in (lb) to have BMI = 25: 143.1   GEN: WDWN, NAD, Non-toxic, Alert & Oriented x 3 HEENT: Atraumatic, Normocephalic.  Ears and Nose: No external deformity. EXTR: No clubbing/cyanosis/edema NEURO: Normal gait.  PSYCH: Normally interactive. Conversant. Not  depressed or anxious appearing.  Calm demeanor.   Knee:  B Gait: Normal heel toe pattern ROM: -5 - 110 B Effusion: minimal on the R Echymosis or edema: none Patellar tendon NT Painful PLICA: neg Patellar grind: pos Medial and lateral patellar facet loading: pain medial and lateral joint lines: ttp b Mcmurray's pain Flexion-pinch pain Varus and valgus stress: stable Lachman: neg Ant and Post drawer: neg Hip abduction, IR, ER: WNL Hip flexion str: 5/5 Hip abd: 5/5 Quad: 5/5 VMO atrophy:No Hamstring concentric and eccentric: 5/5   Radiology: No results found.  Assessment & Plan:   Osteoarthritis, knee  We discussed treatment strategies including: Tylenol on a routine bases, 2 tablets up  to 3-4 times a day Alleve 2 tabs by mouth two times a day over the counter: Take at least for 2 - 3 weeks. This is equal to a prescripton strength dose (GENERIC CHEAPER EQUIVALENT IS NAPROXEN SODIUM)   During an acute flare, intraarticular corticosteroids can be helpful. Hyaluronic Acid injections also have had good success in treating grade I - III OA, not grade IV  Modified impact physical activity can often help  We will inject her more symptomatic R knee today, and she is going to start exercising.  Knee Injection, R Patient verbally consented to procedure. Risks (including potential rare risk of infection), benefits, and alternatives explained. Sterilely prepped with Chloraprep. Ethyl cholride used for anesthesia. 8 cc Lidocaine 1% mixed with Depo-Medrol 80 mg injected using the anteromedial approach without difficulty. No complications with procedure and tolerated well. Patient had decreased pain post-injection.   Follow-up: No Follow-up on file. Unless noted above, the patient is to follow-up if symptoms worsen. Red flags were reviewed with the patient.  New Prescriptions   No medications on file   No orders of the defined types were placed in this encounter.    Signed,  Maud Deed. Jonpaul Lumm, MD, [employer]   Patient's Medications  New Prescriptions   No medications on file  Previous Medications   ATENOLOL-CHLORTHALIDONE (TENORETIC) 100-25 MG PER TABLET    TAKE 1 TABLET BY MOUTH DAILY   CHOLECALCIFEROL (VITAMIN D) 400 UNITS TABS TABLET    Take 400 Units by mouth 2 (two) times daily.   GLUCOSAMINE SULFATE 1000 MG TABS    Take 1 tablet by mouth daily.   MULTIPLE VITAMIN (MULTIVITAMIN) TABLET    Take 1 tablet by mouth daily.   OMEGA-3 FATTY ACIDS (FISH OIL) 1000 MG CAPS    Take 1 capsule by mouth daily.   RED YEAST RICE 600 MG CAPS    Take 1,200 mg by mouth 2 (two) times daily.  Modified Medications   No medications on file  Discontinued Medications   No  medications on file

## 2014-06-04 ENCOUNTER — Ambulatory Visit: Payer: Self-pay | Admitting: Family Medicine

## 2014-06-05 ENCOUNTER — Encounter: Payer: Self-pay | Admitting: Family Medicine

## 2014-11-03 ENCOUNTER — Other Ambulatory Visit: Payer: Self-pay | Admitting: Family Medicine

## 2015-01-15 LAB — HM MAMMOGRAPHY: HM Mammogram: NORMAL

## 2015-04-15 ENCOUNTER — Ambulatory Visit (INDEPENDENT_AMBULATORY_CARE_PROVIDER_SITE_OTHER): Payer: Medicare Other | Admitting: Family Medicine

## 2015-04-15 ENCOUNTER — Encounter: Payer: Self-pay | Admitting: Family Medicine

## 2015-04-15 VITALS — BP 120/80 | HR 55 | Temp 97.7°F | Ht 63.5 in | Wt 188.5 lb

## 2015-04-15 DIAGNOSIS — M1712 Unilateral primary osteoarthritis, left knee: Secondary | ICD-10-CM | POA: Diagnosis not present

## 2015-04-15 DIAGNOSIS — M25562 Pain in left knee: Secondary | ICD-10-CM

## 2015-04-15 MED ORDER — METHYLPREDNISOLONE ACETATE 40 MG/ML IJ SUSP
80.0000 mg | Freq: Once | INTRAMUSCULAR | Status: AC
  Administered 2015-04-15: 80 mg via INTRA_ARTICULAR

## 2015-04-15 NOTE — Progress Notes (Signed)
Dr. Frederico Hamman T. Sandrina Heaton, MD, [facility] [place], [address] Phone: [phone] Fax: [phone]  04/15/2015  Patient: Jade Mcguire, MRN: [id number], DOB: 01/29/1951, 64 y.o.  Primary Physician:  Eliezer Lofts, MD  Chief Complaint: Knee Pain  Subjective:   Jade Mcguire is a 64 y.o. very pleasant female patient who presents with the following:  Left knee is bothering her some. This is a very pleasant lady who I remember from last year, when I saw her about her right knee that was bothering her.  She is intermittently done different things including anti-inflammatories, Tylenol, glucosamine and other supplements, but she continues to have intermittent very deep pain.  Last year she got about 8 or 9 months of relief of symptoms on her right knee after an intra-articular corticosteroid injection.  Her right knee still actually feels fairly okay now.  Past Medical History, Surgical History, Social History, Family History, Problem List, Medications, and Allergies have been reviewed and updated if relevant.  GEN: No fevers, chills. Nontoxic. Primarily MSK c/o today. MSK: Detailed in the HPI GI: tolerating PO intake without difficulty Neuro: No numbness, parasthesias, or tingling associated. Otherwise the pertinent positives of the ROS are noted above.   Objective:   BP 120/80 mmHg  Pulse 55  Temp(Src) 97.7 F (36.5 C) (Oral)  Ht 5' 3.5" (1.613 m)  Wt 188 lb 8 oz (85.503 kg)  BMI 32.86 kg/m2   GEN: WDWN, NAD, Non-toxic, Alert & Oriented x 3 HEENT: Atraumatic, Normocephalic.  Ears and Nose: No external deformity. EXTR: No clubbing/cyanosis/edema NEURO: Normal gait.  PSYCH: Normally interactive. Conversant. Not depressed or anxious appearing.  Calm demeanor.    Left knee: Full extension and flexion To 115.  Tender on the medial and lateral joint lines.  There is significant patellar crepitus.  Stable MCL, LCL, PCL  and ACL.  There is some tenderness with McMurray's.  The knee is stable overall and there is no significant effusion.  Radiology: No results found.  Assessment and Plan:   Primary osteoarthritis of left knee  Left knee pain - Plan: methylPREDNISolone acetate (DEPO-MEDROL) injection 80 mg  Weight loss, conservative care.   Knee Injection, L Patient verbally consented to procedure. Risks (including potential rare risk of infection), benefits, and alternatives explained. Sterilely prepped with Chloraprep. Ethyl cholride used for anesthesia. 8 cc Lidocaine 1% mixed with Depo-Medrol 80 mg injected using the anteromedial approach without difficulty. No complications with procedure and tolerated well. Patient had decreased pain post-injection.   Follow-up: Return if symptoms worsen or fail to improve.  Signed,  Maud Deed. Orrie Schubert, MD   Patient's Medications  New Prescriptions   No medications on file  Previous Medications   ATENOLOL-CHLORTHALIDONE (TENORETIC) 100-25 MG PER TABLET    TAKE 1 TABLET BY MOUTH DAILY   CHOLECALCIFEROL (VITAMIN D) 400 UNITS TABS TABLET    Take 400 Units by mouth 2 (two) times daily.   GLUCOSAMINE SULFATE 1000 MG TABS    Take 1 tablet by mouth daily.   MULTIPLE VITAMIN (MULTIVITAMIN) TABLET    Take 1 tablet by mouth daily.   OMEGA-3 FATTY ACIDS (FISH OIL) 1000 MG CAPS    Take 1 capsule by mouth daily.   RED YEAST RICE 600 MG CAPS    Take 1,200 mg by mouth 2 (two) times daily.  Modified Medications   No medications on file  Discontinued Medications   No medications on file

## 2015-04-15 NOTE — Progress Notes (Signed)
Pre visit review using our clinic review tool, if applicable. No additional management support is needed unless otherwise documented below in the visit note. 

## 2015-05-04 ENCOUNTER — Other Ambulatory Visit: Payer: Self-pay | Admitting: Family Medicine

## 2015-05-30 ENCOUNTER — Other Ambulatory Visit: Payer: Self-pay | Admitting: Family Medicine

## 2015-06-02 ENCOUNTER — Telehealth: Payer: Self-pay | Admitting: Family Medicine

## 2015-06-02 DIAGNOSIS — R7303 Prediabetes: Secondary | ICD-10-CM

## 2015-06-02 DIAGNOSIS — M8580 Other specified disorders of bone density and structure, unspecified site: Secondary | ICD-10-CM

## 2015-06-02 DIAGNOSIS — E785 Hyperlipidemia, unspecified: Secondary | ICD-10-CM

## 2015-06-02 NOTE — Telephone Encounter (Signed)
-----   Message from Ellamae Sia sent at 05/27/2015  5:51 PM EDT ----- Regarding: Lab orders for Thursday, 6.9.16 Patient is scheduled for CPX labs, please order future labs, Thanks , Karna Christmas

## 2015-06-04 ENCOUNTER — Other Ambulatory Visit (INDEPENDENT_AMBULATORY_CARE_PROVIDER_SITE_OTHER): Payer: Medicare Other

## 2015-06-04 DIAGNOSIS — R7309 Other abnormal glucose: Secondary | ICD-10-CM | POA: Diagnosis not present

## 2015-06-04 DIAGNOSIS — E785 Hyperlipidemia, unspecified: Secondary | ICD-10-CM

## 2015-06-04 DIAGNOSIS — M8580 Other specified disorders of bone density and structure, unspecified site: Secondary | ICD-10-CM | POA: Diagnosis not present

## 2015-06-04 DIAGNOSIS — R7303 Prediabetes: Secondary | ICD-10-CM

## 2015-06-04 LAB — COMPREHENSIVE METABOLIC PANEL
ALBUMIN: 4.1 g/dL (ref 3.5–5.2)
ALK PHOS: 78 U/L (ref 39–117)
ALT: 11 U/L (ref 0–35)
AST: 19 U/L (ref 0–37)
BILIRUBIN TOTAL: 0.6 mg/dL (ref 0.2–1.2)
BUN: 17 mg/dL (ref 6–23)
CALCIUM: 9.6 mg/dL (ref 8.4–10.5)
CO2: 32 mEq/L (ref 19–32)
Chloride: 97 mEq/L (ref 96–112)
Creatinine, Ser: 0.86 mg/dL (ref 0.40–1.20)
GFR: 70.08 mL/min (ref >60.00)
Glucose, Bld: 119 mg/dL — ABNORMAL HIGH (ref 70–99)
POTASSIUM: 3.7 meq/L (ref 3.5–5.1)
Sodium: 135 mEq/L (ref 135–145)
Total Protein: 7 g/dL (ref 6.0–8.3)

## 2015-06-04 LAB — LIPID PANEL
Cholesterol: 214 mg/dL — ABNORMAL HIGH (ref 0–200)
HDL: 33.2 mg/dL — AB (ref >39.00)
NonHDL: 180.8
TRIGLYCERIDES: 239 mg/dL — AB (ref 0.0–149.0)
Total CHOL/HDL Ratio: 6
VLDL: 47.8 mg/dL — ABNORMAL HIGH (ref 0.0–40.0)

## 2015-06-04 LAB — HEMOGLOBIN A1C: Hgb A1c MFr Bld: 5.9 % (ref 4.6–6.5)

## 2015-06-04 LAB — VITAMIN D 25 HYDROXY (VIT D DEFICIENCY, FRACTURES): VITD: 43.11 ng/mL (ref 30.00–100.00)

## 2015-06-04 LAB — LDL CHOLESTEROL, DIRECT: LDL DIRECT: 126 mg/dL

## 2015-06-11 ENCOUNTER — Ambulatory Visit (INDEPENDENT_AMBULATORY_CARE_PROVIDER_SITE_OTHER): Payer: Medicare Other | Admitting: Family Medicine

## 2015-06-11 ENCOUNTER — Encounter: Payer: Self-pay | Admitting: Family Medicine

## 2015-06-11 VITALS — BP 130/80 | HR 54 | Temp 98.0°F | Ht 64.0 in | Wt 186.0 lb

## 2015-06-11 DIAGNOSIS — E785 Hyperlipidemia, unspecified: Secondary | ICD-10-CM

## 2015-06-11 DIAGNOSIS — Z23 Encounter for immunization: Secondary | ICD-10-CM

## 2015-06-11 DIAGNOSIS — R7303 Prediabetes: Secondary | ICD-10-CM

## 2015-06-11 DIAGNOSIS — Z0000 Encounter for general adult medical examination without abnormal findings: Secondary | ICD-10-CM | POA: Diagnosis not present

## 2015-06-11 DIAGNOSIS — Z7189 Other specified counseling: Secondary | ICD-10-CM | POA: Insufficient documentation

## 2015-06-11 DIAGNOSIS — I10 Essential (primary) hypertension: Secondary | ICD-10-CM

## 2015-06-11 NOTE — Progress Notes (Signed)
Pre visit review using our clinic review tool, if applicable. No additional management support is needed unless otherwise documented below in the visit note. 

## 2015-06-11 NOTE — Assessment & Plan Note (Signed)
LDL at goal but trig high. Work on low [employer].

## 2015-06-11 NOTE — Progress Notes (Signed)
WELCOME TO MEDICARE I have personally reviewed the Medicare Annual Wellness questionnaire and have noted 1.The patient's medical and social history 2.Their use of alcohol, tobacco or illicit drugs 3.Their current medications and supplements 4.The patient's functional ability including ADL's, fall risks, home safety risks and hearing or visual  impairment. 5.Diet and physical activities 6.Evidence for depression or mood disorders The patients weight, height, BMI and visual acuity have been recorded in the chart I have made referrals, counseling and provided education to the patient based review of the above and I have provided the pt with a written personalized care plan for preventive services.  Hypertension: Well controlled on atenolol/ chlorthalidone BP Readings from Last 3 Encounters:  06/11/15 130/80  04/15/15 120/80  05/08/14 134/88  Using medication without problems or lightheadedness: None Chest pain with exertion:None Edema:None Short of breath:None Average home BPs: not checking Other issues:  Elevated Cholesterol: LDL at  Goal < 130 on no med. Lab Results  Component Value Date   CHOL 214* 06/04/2015   HDL 33.20* 06/04/2015   LDLCALC 145* 04/15/2014   LDLDIRECT 126.0 06/04/2015   TRIG 239.0* 06/04/2015   CHOLHDL 6 06/04/2015  Diet compliance: Moderate.. Recently joined [employer]. Exercise: None Other complaints:  Prediabetes: Stable control.    Glucose 119 Lab Results  Component Value Date   HGBA1C 5.9 06/04/2015  .Body mass index is 31.91 kg/(m^2).    Review of Systems  Constitutional: Negative for fever and fatigue.  HENT: Negative for ear pain.  Eyes: Negative for pain.  Respiratory: Negative for chest tightness and shortness of breath.  Cardiovascular: Negative for chest pain, palpitations and leg swelling.  Gastrointestinal: Negative for abdominal pain.  Genitourinary: Negative  for dysuria.       Objective:   Physical Exam  Constitutional: Vital signs are normal. She appears well-developed and well-nourished. She is cooperative. Non-toxic appearance. She does not appear ill. No distress.  HENT:  Head: Normocephalic.  Right Ear: Hearing, tympanic membrane, external ear and ear canal normal.  Left Ear: Hearing, tympanic membrane, external ear and ear canal normal.  Nose: Nose normal.  Eyes: Conjunctivae, EOM and lids are normal. Pupils are equal, round, and reactive to light. Lids are everted and swept, no foreign bodies found.  Neck: Trachea normal and normal range of motion. Neck supple. Carotid bruit is not present. No mass and no thyromegaly present.  Cardiovascular: Normal rate, regular rhythm, S1 normal, S2 normal, normal heart sounds and intact distal pulses. Exam reveals no gallop.  No murmur heard. Pulmonary/Chest: Effort normal and breath sounds normal. No respiratory distress. She has no wheezes. She has no rhonchi. She has no rales.  Abdominal: Soft. Normal appearance and bowel sounds are normal. She exhibits no distension, no fluid wave, no abdominal bruit and no mass. There is no hepatosplenomegaly. There is no tenderness. There is no rebound, no guarding and no CVA tenderness. No hernia.  Genitourinary: Deferred to next year.   Nml breast exam. No masses, no nipple changes. Lymphadenopathy:   She has no cervical adenopathy.   She has no axillary adenopathy.  Neurological: She is alert. She has normal strength. No cranial nerve deficit or sensory deficit.  Skin: Skin is warm, dry and intact. No rash noted.  Psychiatric: Her speech is normal and behavior is normal. Judgment normal. Her mood appears not anxious. Cognition and memory are normal. She does not exhibit a depressed mood.          Assessment & Plan:  The patient's  preventative maintenance and recommended screening tests for an annual wellness exam were reviewed in full  today. Brought up to date unless services declined.  Counselled on the importance of diet, exercise, and its role in overall health and mortality. The patient's FH and SH was reviewed, including their home life, tobacco status, and drug and alcohol status.   Vaccines:Tdap uptodate, look into shingles, but will get prenvar. Mammo:05/2014 stable DEXA:05/2014 stable osteopenia, mother with osteoporosis Vit D nml. Pap/DVE: partial hysterectomy for fibroids, plan DVE every other year. No fam hx ovarian Ca Colon:  10/2012 Dr. Fuller Plan, repeat in 10 years Former smoker, 35 pack year hx, quit 3 years ago. No daily cough, no SOB. Last spiro nml 2012 She refuses lung cancer CT screening his year.

## 2015-06-11 NOTE — Addendum Note (Signed)
Addended by: Despina Hidden on: 06/11/2015 12:58 PM   Modules accepted: Orders

## 2015-06-11 NOTE — Assessment & Plan Note (Signed)
Well controlled. Continue current medication.  

## 2015-06-11 NOTE — Assessment & Plan Note (Signed)
Info given on low carb diet, increase exercise nad weight loss.

## 2015-06-11 NOTE — Patient Instructions (Addendum)
Work on low carbs, get back to exercise. Look into shingles vaccine coverage. Consider lung CT screening for cancer, call if interested in referral.

## 2015-06-26 ENCOUNTER — Other Ambulatory Visit: Payer: Self-pay | Admitting: Family Medicine

## 2015-07-16 ENCOUNTER — Telehealth: Payer: Self-pay

## 2015-07-16 ENCOUNTER — Encounter: Payer: Self-pay | Admitting: Family Medicine

## 2015-07-16 NOTE — Telephone Encounter (Signed)
Called patient to notify her of being due for a Mammogram. Patient stated that she has already had one at [facility]. Patient estimated that her appointment was in January of 2016 Patient stated that the results came back normal.

## 2015-12-30 ENCOUNTER — Other Ambulatory Visit: Payer: Self-pay | Admitting: Family Medicine

## 2016-06-30 ENCOUNTER — Telehealth: Payer: Self-pay | Admitting: Family Medicine

## 2016-06-30 NOTE — Telephone Encounter (Signed)
Please call and schedule CPE with fasting labs prior with Dr. Bedsole. 

## 2016-07-01 ENCOUNTER — Encounter: Payer: Self-pay | Admitting: Family Medicine

## 2016-07-01 NOTE — Telephone Encounter (Signed)
Pt called back Medicare wellness with lisa and labs 10/4 cpx with dr Diona Browner 10/10

## 2016-07-01 NOTE — Telephone Encounter (Signed)
Voice mail not set up Mailed letter

## 2016-07-27 ENCOUNTER — Other Ambulatory Visit: Payer: Self-pay | Admitting: Family Medicine

## 2016-09-27 ENCOUNTER — Telehealth: Payer: Self-pay | Admitting: Family Medicine

## 2016-09-27 DIAGNOSIS — R7303 Prediabetes: Secondary | ICD-10-CM

## 2016-09-27 DIAGNOSIS — E7800 Pure hypercholesterolemia, unspecified: Secondary | ICD-10-CM

## 2016-09-27 NOTE — Telephone Encounter (Signed)
-----   Message from Ellamae Sia sent at 09/21/2016  6:35 PM EDT ----- Regarding: Lab orders for Wednesday, 10.4.17 Patient is scheduled for CPX labs, please order future labs, Thanks , Karna Christmas

## 2016-09-28 ENCOUNTER — Ambulatory Visit: Payer: Medicare Other

## 2016-09-28 ENCOUNTER — Other Ambulatory Visit: Payer: Medicare Other

## 2016-09-28 ENCOUNTER — Other Ambulatory Visit (INDEPENDENT_AMBULATORY_CARE_PROVIDER_SITE_OTHER): Payer: Medicare Other | Admitting: Family Medicine

## 2016-09-28 DIAGNOSIS — R7989 Other specified abnormal findings of blood chemistry: Secondary | ICD-10-CM | POA: Diagnosis not present

## 2016-09-28 DIAGNOSIS — E7800 Pure hypercholesterolemia, unspecified: Secondary | ICD-10-CM | POA: Diagnosis not present

## 2016-09-28 DIAGNOSIS — R7303 Prediabetes: Secondary | ICD-10-CM

## 2016-09-28 LAB — LIPID PANEL
CHOLESTEROL: 207 mg/dL — AB (ref 0–200)
HDL: 34.8 mg/dL — ABNORMAL LOW (ref >39.00)
NonHDL: 172.44
Total CHOL/HDL Ratio: 6
Triglycerides: 209 mg/dL — ABNORMAL HIGH (ref 0.0–149.0)
VLDL: 41.8 mg/dL — AB (ref 0.0–40.0)

## 2016-09-28 LAB — COMPREHENSIVE METABOLIC PANEL
ALBUMIN: 4 g/dL (ref 3.5–5.2)
ALK PHOS: 63 U/L (ref 39–117)
ALT: 10 U/L (ref 0–35)
AST: 20 U/L (ref 0–37)
BILIRUBIN TOTAL: 0.6 mg/dL (ref 0.2–1.2)
BUN: 15 mg/dL (ref 6–23)
CO2: 33 mEq/L — ABNORMAL HIGH (ref 19–32)
Calcium: 9.7 mg/dL (ref 8.4–10.5)
Chloride: 96 mEq/L (ref 96–112)
Creatinine, Ser: 0.88 mg/dL (ref 0.40–1.20)
GFR: 67.97 mL/min (ref >60.00)
Glucose, Bld: 108 mg/dL — ABNORMAL HIGH (ref 70–99)
POTASSIUM: 3.4 meq/L — AB (ref 3.5–5.1)
Sodium: 137 mEq/L (ref 135–145)
TOTAL PROTEIN: 7.2 g/dL (ref 6.0–8.3)

## 2016-09-28 LAB — LDL CHOLESTEROL, DIRECT: Direct LDL: 138 mg/dL

## 2016-09-28 LAB — HEMOGLOBIN A1C: HEMOGLOBIN A1C: 5.8 % (ref 4.6–6.5)

## 2016-10-04 ENCOUNTER — Ambulatory Visit: Payer: Medicare Other

## 2016-10-04 ENCOUNTER — Encounter: Payer: Medicare Other | Admitting: Family Medicine

## 2016-10-27 ENCOUNTER — Other Ambulatory Visit: Payer: Self-pay | Admitting: Family Medicine

## 2016-10-27 ENCOUNTER — Ambulatory Visit (INDEPENDENT_AMBULATORY_CARE_PROVIDER_SITE_OTHER): Payer: Medicare Other | Admitting: Family Medicine

## 2016-10-27 ENCOUNTER — Encounter: Payer: Self-pay | Admitting: Family Medicine

## 2016-10-27 VITALS — BP 144/80 | HR 54 | Temp 98.3°F | Ht 63.5 in | Wt 189.8 lb

## 2016-10-27 DIAGNOSIS — Z0000 Encounter for general adult medical examination without abnormal findings: Secondary | ICD-10-CM

## 2016-10-27 DIAGNOSIS — R7303 Prediabetes: Secondary | ICD-10-CM | POA: Diagnosis not present

## 2016-10-27 DIAGNOSIS — Z1159 Encounter for screening for other viral diseases: Secondary | ICD-10-CM | POA: Diagnosis not present

## 2016-10-27 DIAGNOSIS — E7800 Pure hypercholesterolemia, unspecified: Secondary | ICD-10-CM

## 2016-10-27 DIAGNOSIS — I10 Essential (primary) hypertension: Secondary | ICD-10-CM | POA: Diagnosis not present

## 2016-10-27 MED ORDER — ATENOLOL-CHLORTHALIDONE 100-25 MG PO TABS: 1.0000 | ORAL_TABLET | Freq: Every day | ORAL | 3 refills | Status: DC

## 2016-10-27 NOTE — Assessment & Plan Note (Signed)
One elevated measurement.. Follow at home.. Amy need to increase meds. Encouraged exercise, weight loss, healthy eating habits.

## 2016-10-27 NOTE — Assessment & Plan Note (Signed)
Increase red yeast rice up to at least 1 tab twice daily. Work on [employer].

## 2016-10-27 NOTE — Assessment & Plan Note (Signed)
Stable

## 2016-10-27 NOTE — Progress Notes (Signed)
Pre visit review using our clinic review tool, if applicable. No additional management support is needed unless otherwise documented below in the visit note. 

## 2016-10-27 NOTE — Progress Notes (Addendum)
I have personally reviewed the Medicare Annual Wellness questionnaire and have noted 1.The patient's medical and social history 2.Their use of alcohol, tobacco or illicit drugs 3.Their current medications and supplements 4.The patient's functional ability including ADL's, fall risks, home safety risks and hearing or visual  impairment. 5.Diet and physical activities 6.Evidence for depression or mood disorders The patients weight, height, BMI and visual acuity have been recorded in the chart I have made referrals, counseling and provided education to the patient based review of the above and I have provided the pt with a written personalized care plan for preventive services.  Patient Care Team    Relationship Specialty Notifications Start End  Jinny Sanders, MD PCP - General   12/08/10    Comment: Merged    Hypertension: inadequately controlled on atenolol/chlorthalidone     BP Readings from Last 3 Encounters:  10/27/16 (!) 144/80  06/11/15 130/80  04/15/15 120/80  Using medication without problems or lightheadedness: None Chest pain with exertion:None Edema:None Short of breath:None Average home BPs: not checking Other issues:  Elevated Cholesterol: LDL at  ALMOST Goal < 130 on red yeast rice, omega 3 Lab Results  Component Value Date   CHOL 207 (H) 09/28/2016   HDL 34.80 (L) 09/28/2016   LDLCALC 145 (H) 04/15/2014   LDLDIRECT 138.0 09/28/2016   TRIG 209.0 (H) 09/28/2016   CHOLHDL 6 09/28/2016  Diet compliance: Moderate.. QUIT weight watchers. Exercise: None Other complaints:  Prediabetes: Stable control.    Glucose 108 Lab Results  Component Value Date   HGBA1C 5.8 09/28/2016   Body mass index is 33.09 kg/m. Wt Readings from Last 3 Encounters:  10/27/16 189 lb 12 oz (86.1 kg)  06/11/15 186 lb (84.4 kg)  04/15/15 188 lb 8 oz (85.5 kg)    Social History /Family History/Past Medical History  reviewed and updated if needed.   Review of Systems  Constitutional: Negative for fever and fatigue.  HENT: Negative for ear pain.  Eyes: Negative for pain.  Respiratory: Negative for chest tightness and shortness of breath.  Cardiovascular: Negative for chest pain, palpitations and leg swelling.  Gastrointestinal: Negative for abdominal pain.  Genitourinary: Negative for dysuria.       Objective:   Physical Exam  Constitutional: Vital signs are normal. She appears well-developed and well-nourished. She is cooperative. Non-toxic appearance. She does not appear ill. No distress.  HENT:  Head: Normocephalic.  Right Ear: Hearing, tympanic membrane, external ear and ear canal normal.  Left Ear: Hearing, tympanic membrane, external ear and ear canal normal.  Nose: Nose normal.  Eyes: Conjunctivae, EOM and lids are normal. Pupils are equal, round, and reactive to light. Lids are everted and swept, no foreign bodies found.  Neck: Trachea normal and normal range of motion. Neck supple. Carotid bruit is not present. No mass and no thyromegaly present.  Cardiovascular: Normal rate, regular rhythm, S1 normal, S2 normal, normal heart sounds and intact distal pulses. Exam reveals no gallop.  No murmur heard. Pulmonary/Chest: Effort normal and breath sounds normal. No respiratory distress. She has no wheezes. She has no rhonchi. She has no rales.  Abdominal: Soft. Normal appearance and bowel sounds are normal. She exhibits no distension, no fluid wave, no abdominal bruit and no mass. There is no hepatosplenomegaly. There is no tenderness. There is no rebound, no guarding and no CVA tenderness. No hernia.  Genitourinary: Deferred to next year.   Nml breast exam. No masses, no nipple changes. Lymphadenopathy:   She has no  cervical adenopathy.   She has no axillary adenopathy.  Neurological: She is alert. She has normal strength. No cranial nerve deficit or sensory deficit.  Skin:  Skin is warm, dry and intact. No rash noted.  Psychiatric: Her speech is normal and behavior is normal. Judgment normal. Her mood appears not anxious. Cognition and memory are normal. She does not exhibit a depressed mood.          Assessment & Plan:  The patient's preventative maintenance and recommended screening tests for an annual wellness exam were reviewed in full today. Brought up to date unless services declined.  Counselled on the importance of diet, exercise, and its role in overall health and mortality. The patient's FH and SH was reviewed, including their home life, tobacco status, and drug and alcohol status.   Vaccines: Uptodate, refused flyu. Mammo:05/2014 stable, plan q 2 years. DEXA:05/2014 stable osteopenia, mother with osteoporosis Vit D nml. Repeat in 5 years. Pap/DVE: partial hysterectomy for fibroids,  No fam hx ovarian Ca. No DVE indicated. Colon:  10/2012 Dr. Fuller Plan, repeat in 10 years Former smoker, 33 pack year hx, quit 4 years ago. No daily cough, no SOB. Last spiro nml 2012 She refuses lung cancer CT screening  Again this year. Hep C:  Will do.

## 2016-10-27 NOTE — Patient Instructions (Addendum)
Stopa t lab on way out for Hep C.  Follow blood pressure at home, get a cuff. Check it daily x 1-2 weeks.. Call or email with measurements. Goal < 140/90.  Work on low chol low carb diet and weight loss.  Start water exercise or other impact exercise.  Call to set up mammogram up on your own.  Hypokalemia Hypokalemia means that the amount of potassium in the blood is lower than normal.Potassium is a chemical, called an electrolyte, that helps regulate the amount of fluid in the body. It also stimulates muscle contraction and helps nerves function properly.Most of the body's potassium is inside of cells, and only a very small amount is in the blood. Because the amount in the blood is so small, minor changes can be life-threatening. CAUSES  Antibiotics.  Diarrhea or vomiting.  Using laxatives too much, which can cause diarrhea.  Chronic kidney disease.  Water pills (diuretics).  Eating disorders (bulimia).  Low magnesium level.  Sweating a lot. SIGNS AND SYMPTOMS  Weakness.  Constipation.  Fatigue.  Muscle cramps.  Mental confusion.  Skipped heartbeats or irregular heartbeat (palpitations).  Tingling or numbness. DIAGNOSIS  Your health care provider can diagnose hypokalemia with blood tests. In addition to checking your potassium level, your health care provider may also check other lab tests. TREATMENT Hypokalemia can be treated with potassium supplements taken by mouth or adjustments in your current medicines. If your potassium level is very low, you may need to get potassium through a vein (IV) and be monitored in the hospital. A diet high in potassium is also helpful. Foods high in potassium are:  Nuts, such as peanuts and pistachios.  Seeds, such as sunflower seeds and pumpkin seeds.  Peas, lentils, and lima beans.  Whole grain and bran cereals and breads.  Fresh fruit and vegetables, such as apricots, avocado, bananas, cantaloupe, kiwi, oranges, tomatoes,  asparagus, and potatoes.  Orange and tomato juices.  Red meats.  Fruit yogurt. HOME CARE INSTRUCTIONS  Take all medicines as prescribed by your health care provider.  Maintain a healthy diet by including nutritious food, such as fruits, vegetables, nuts, whole grains, and lean meats.  If you are taking a laxative, be sure to follow the directions on the label. SEEK MEDICAL CARE IF:  Your weakness gets worse.  You feel your heart pounding or racing.  You are vomiting or having diarrhea.  You are diabetic and having trouble keeping your blood glucose in the normal range. SEEK IMMEDIATE MEDICAL CARE IF:  You have chest pain, shortness of breath, or dizziness.  You are vomiting or having diarrhea for more than 2 days.  You faint. MAKE SURE YOU:   Understand these instructions.  Will watch your condition.  Will get help right away if you are not doing well or get worse.   This information is not intended to replace advice given to you by your health care provider. Make sure you discuss any questions you have with your health care provider.   Document Released: 12/12/2005 Document Revised: 01/02/2015 Document Reviewed: 06/14/2013 Elsevier Interactive Patient Education [employer].

## 2016-10-28 LAB — HEPATITIS C ANTIBODY: HCV Ab: NEGATIVE

## 2017-04-18 ENCOUNTER — Other Ambulatory Visit: Payer: Self-pay | Admitting: Family Medicine

## 2017-04-18 DIAGNOSIS — Z1231 Encounter for screening mammogram for malignant neoplasm of breast: Secondary | ICD-10-CM

## 2017-05-02 ENCOUNTER — Ambulatory Visit
Admission: RE | Admit: 2017-05-02 | Discharge: 2017-05-02 | Disposition: A | Discharge status: Home / Self Care | Payer: Medicare Other | Source: Ambulatory Visit | Attending: Family Medicine | Admitting: Family Medicine

## 2017-05-02 DIAGNOSIS — Z1231 Encounter for screening mammogram for malignant neoplasm of breast: Secondary | ICD-10-CM | POA: Insufficient documentation

## 2017-11-07 ENCOUNTER — Other Ambulatory Visit: Payer: Self-pay | Admitting: Family Medicine

## 2017-11-10 ENCOUNTER — Ambulatory Visit (INDEPENDENT_AMBULATORY_CARE_PROVIDER_SITE_OTHER): Payer: Medicare Other | Admitting: Family Medicine

## 2017-11-10 ENCOUNTER — Other Ambulatory Visit: Payer: Self-pay

## 2017-11-10 ENCOUNTER — Encounter: Payer: Self-pay | Admitting: Family Medicine

## 2017-11-10 VITALS — BP 118/80 | HR 55 | Temp 98.3°F | Ht 63.25 in | Wt 188.5 lb

## 2017-11-10 DIAGNOSIS — Z23 Encounter for immunization: Secondary | ICD-10-CM

## 2017-11-10 DIAGNOSIS — R7303 Prediabetes: Secondary | ICD-10-CM | POA: Diagnosis not present

## 2017-11-10 DIAGNOSIS — Z0000 Encounter for general adult medical examination without abnormal findings: Secondary | ICD-10-CM

## 2017-11-10 DIAGNOSIS — I10 Essential (primary) hypertension: Secondary | ICD-10-CM | POA: Diagnosis not present

## 2017-11-10 DIAGNOSIS — E7800 Pure hypercholesterolemia, unspecified: Secondary | ICD-10-CM | POA: Diagnosis not present

## 2017-11-10 DIAGNOSIS — E669 Obesity, unspecified: Secondary | ICD-10-CM | POA: Diagnosis not present

## 2017-11-10 LAB — COMPREHENSIVE METABOLIC PANEL
ALK PHOS: 65 U/L (ref 39–117)
ALT: 10 U/L (ref 0–35)
AST: 18 U/L (ref 0–37)
Albumin: 4.3 g/dL (ref 3.5–5.2)
BUN: 20 mg/dL (ref 6–23)
CHLORIDE: 97 meq/L (ref 96–112)
CO2: 32 mEq/L (ref 19–32)
Calcium: 10.1 mg/dL (ref 8.4–10.5)
Creatinine, Ser: 0.99 mg/dL (ref 0.40–1.20)
GFR: 59.14 mL/min — AB (ref >60.00)
GLUCOSE: 113 mg/dL — AB (ref 70–99)
POTASSIUM: 4.2 meq/L (ref 3.5–5.1)
SODIUM: 138 meq/L (ref 135–145)
TOTAL PROTEIN: 7.2 g/dL (ref 6.0–8.3)
Total Bilirubin: 0.6 mg/dL (ref 0.2–1.2)

## 2017-11-10 LAB — HEMOGLOBIN A1C: Hgb A1c MFr Bld: 5.8 % (ref 4.6–6.5)

## 2017-11-10 NOTE — Patient Instructions (Addendum)
Please stop at the lab to have labs drawn.  Set up living will and HCPOA.. Bring info to office.

## 2017-11-10 NOTE — Assessment & Plan Note (Signed)
Well controlled. Continue current medication. Encouraged exercise, weight loss, healthy eating habits.  

## 2017-11-10 NOTE — Assessment & Plan Note (Signed)
Due for re-eval. 

## 2017-11-10 NOTE — Progress Notes (Signed)
Subjective:    Patient ID: Jade Mcguire, female    DOB: 27-Sep-1951, 66 y.o.   MRN: [id number]  HPI   The patient is here for annual wellness exam and preventative care.    I have personally reviewed the Medicare Annual Wellness questionnaire and have noted 1. The patient's medical and social history 2. Their use of alcohol, tobacco or illicit drugs 3. Their current medications and supplements 4. The patient's functional ability including ADL's, fall risks, home safety risks and hearing or visual             impairment. 5. Diet and physical activities 6. Evidence for depression or mood disorders 7.         Updated provider list Cognitive evaluation was performed and recorded on pt medicare questionnaire form. The patients weight, height, BMI and visual acuity have been recorded in the chart  I have made referrals, counseling and provided education to the patient based review of the above and I have provided the pt with a written personalized care plan for preventive services.   Documentation of this information was scanned into the electronic record under the media tab.  Hypertension:  Good control on atenolol and chlorthalidone   BP Readings from Last 3 Encounters:  11/10/17 118/80  10/27/16 (!) 144/80  06/11/15 130/80  Using medication without problems or lightheadedness:  none Chest pain with exertion: none Edema: occassional Short of breath: none Average home BPs: Other issues:  Elevated Cholesterol:  Due for re-eval. On red yeast rice, omega threes Lab Results  Component Value Date   CHOL 207 (H) 09/28/2016   HDL 34.80 (L) 09/28/2016   LDLCALC 145 (H) 04/15/2014   LDLDIRECT 138.0 09/28/2016   TRIG 209.0 (H) 09/28/2016   CHOLHDL 6 09/28/2016  Using medications without problems: Muscle aches:  Diet compliance:  Working on low chol diet Exercise: walking 3-4 a week Other complaints:  Prediabetes.. Due for re-eval.  Body mass index is 33.13 kg/m. Wt Readings  from Last 3 Encounters:  11/10/17 188 lb 8 oz (85.5 kg)  10/27/16 189 lb 12 oz (86.1 kg)  06/11/15 186 lb (84.4 kg)   Social History /Family History/Past Medical History reviewed in detail and updated in EMR if needed.  Hearing Screening   Method: Audiometry   125Hz  250Hz  500Hz  1000Hz  2000Hz  3000Hz  4000Hz  6000Hz  8000Hz   Right ear:   20 20 20  20     Left ear:   20 20 20  20     Vision Screening Comments: Yearly eye exam done 07/2017  Advance directives and end of life planning reviewed in detail with patient and documented in EMR. Patient given handout on advance care directives if needed. HCPOA and living will updated if needed. Fall Risk  11/10/2017 10/27/2016 04/15/2014  Falls in the past year? No No No   Depression screen [facility] 2/9 11/10/2017 10/27/2016 04/15/2014  Decreased Interest 0 0 0  Down, Depressed, Hopeless 0 0 0  PHQ - 2 Score 0 0 0   Blood pressure 118/80, pulse (!) 55, temperature 98.3 F (36.8 C), temperature source Oral, height 5' 3.25" (1.607 m), weight 188 lb 8 oz (85.5 kg).    Review of Systems  Constitutional: Negative for fatigue and fever.  HENT: Negative for congestion.   Eyes: Negative for pain.  Respiratory: Negative for cough and shortness of breath.   Cardiovascular: Negative for chest pain, palpitations and leg swelling.  Gastrointestinal: Negative for abdominal pain.  Genitourinary: Negative for dysuria and vaginal  bleeding.  Musculoskeletal: Negative for back pain.  Neurological: Negative for syncope, light-headedness and headaches.  Psychiatric/Behavioral: Negative for dysphoric mood.       Objective:   Physical Exam  Constitutional: Vital signs are normal. She appears well-developed and well-nourished. She is cooperative.  Non-toxic appearance. She does not appear ill. No distress.  HENT:  Head: Normocephalic.  Right Ear: Hearing, tympanic membrane, external ear and ear canal normal.  Left Ear: Hearing, tympanic membrane, external ear and ear  canal normal.  Nose: Nose normal.  Eyes: Conjunctivae, EOM and lids are normal. Pupils are equal, round, and reactive to light. Lids are everted and swept, no foreign bodies found.  Neck: Trachea normal and normal range of motion. Neck supple. Carotid bruit is not present. No thyroid mass and no thyromegaly present.  Cardiovascular: Normal rate, regular rhythm, S1 normal, S2 normal, normal heart sounds and intact distal pulses. Exam reveals no gallop.  No murmur heard. Pulmonary/Chest: Effort normal and breath sounds normal. No respiratory distress. She has no wheezes. She has no rhonchi. She has no rales.  Abdominal: Soft. Normal appearance and bowel sounds are normal. She exhibits no distension, no fluid wave, no abdominal bruit and no mass. There is no hepatosplenomegaly. There is no tenderness. There is no rebound, no guarding and no CVA tenderness. No hernia.  Lymphadenopathy:    She has no cervical adenopathy.    She has no axillary adenopathy.  Neurological: She is alert. She has normal strength. No cranial nerve deficit or sensory deficit.  Skin: Skin is warm, dry and intact. No rash noted.  Psychiatric: Her speech is normal and behavior is normal. Judgment normal. Her mood appears not anxious. Cognition and memory are normal. She does not exhibit a depressed mood.          Assessment & Plan:  The patient's preventative maintenance and recommended screening tests for an annual wellness exam were reviewed in full today. Brought up to date unless services declined.  Counselled on the importance of diet, exercise, and its role in overall health and mortality. The patient's FH and SH was reviewed, including their home life, tobacco status, and drug and alcohol status.   Vaccines: Uptodate,  Given today flu Mammo:04/2017 stable, plan q 2 years. DEXA:05/2014 stable osteopenia, mother with osteoporosis Vit D nml. Repeat in 5 years. Pap/DVE: partial hysterectomy for fibroids,  No fam hx  ovarian Ca. No DVE indicated. Colon: 10/2012 Dr. Fuller Plan, repeat in 10 years Former smoker, 33 pack year hx, quit 5 years ago. No daily cough, no SOB. Last spiro nml 2012 She refuses lung cancer CT screening again this year. She  Plans to do when age 32. Hep C:   done

## 2018-02-20 ENCOUNTER — Other Ambulatory Visit: Payer: Self-pay | Admitting: Family Medicine

## 2018-08-17 ENCOUNTER — Other Ambulatory Visit: Payer: Self-pay | Admitting: *Deleted

## 2018-08-17 MED ORDER — ATENOLOL-CHLORTHALIDONE 100-25 MG PO TABS: 1.0000 | ORAL_TABLET | Freq: Every day | ORAL | 0 refills | Status: DC

## 2018-10-25 ENCOUNTER — Ambulatory Visit (INDEPENDENT_AMBULATORY_CARE_PROVIDER_SITE_OTHER): Payer: Medicare Other

## 2018-10-25 DIAGNOSIS — Z23 Encounter for immunization: Secondary | ICD-10-CM

## 2018-11-18 ENCOUNTER — Other Ambulatory Visit: Payer: Self-pay | Admitting: Family Medicine

## 2018-11-27 ENCOUNTER — Encounter: Payer: Self-pay | Admitting: Family Medicine

## 2018-11-27 ENCOUNTER — Ambulatory Visit (INDEPENDENT_AMBULATORY_CARE_PROVIDER_SITE_OTHER): Payer: Medicare Other | Admitting: Family Medicine

## 2018-11-27 VITALS — BP 120/80 | HR 60 | Temp 97.8°F | Ht 63.0 in | Wt 194.5 lb

## 2018-11-27 DIAGNOSIS — M8580 Other specified disorders of bone density and structure, unspecified site: Secondary | ICD-10-CM

## 2018-11-27 DIAGNOSIS — I10 Essential (primary) hypertension: Secondary | ICD-10-CM | POA: Diagnosis not present

## 2018-11-27 DIAGNOSIS — R7303 Prediabetes: Secondary | ICD-10-CM

## 2018-11-27 DIAGNOSIS — E7800 Pure hypercholesterolemia, unspecified: Secondary | ICD-10-CM | POA: Diagnosis not present

## 2018-11-27 DIAGNOSIS — Z1231 Encounter for screening mammogram for malignant neoplasm of breast: Secondary | ICD-10-CM | POA: Diagnosis not present

## 2018-11-27 DIAGNOSIS — Z0000 Encounter for general adult medical examination without abnormal findings: Secondary | ICD-10-CM

## 2018-11-27 DIAGNOSIS — E669 Obesity, unspecified: Secondary | ICD-10-CM

## 2018-11-27 LAB — COMPREHENSIVE METABOLIC PANEL
ALBUMIN: 4.5 g/dL (ref 3.5–5.2)
ALT: 12 U/L (ref 0–35)
AST: 19 U/L (ref 0–37)
Alkaline Phosphatase: 66 U/L (ref 39–117)
BUN: 22 mg/dL (ref 6–23)
CALCIUM: 10.1 mg/dL (ref 8.4–10.5)
CHLORIDE: 96 meq/L (ref 96–112)
CO2: 32 mEq/L (ref 19–32)
Creatinine, Ser: 1.04 mg/dL (ref 0.40–1.20)
GFR: 55.7 mL/min — ABNORMAL LOW (ref >60.00)
Glucose, Bld: 108 mg/dL — ABNORMAL HIGH (ref 70–99)
POTASSIUM: 3.5 meq/L (ref 3.5–5.1)
SODIUM: 135 meq/L (ref 135–145)
Total Bilirubin: 0.6 mg/dL (ref 0.2–1.2)
Total Protein: 7.6 g/dL (ref 6.0–8.3)

## 2018-11-27 LAB — LIPID PANEL
CHOLESTEROL: 222 mg/dL — AB (ref 0–200)
HDL: 35.8 mg/dL — AB (ref >39.00)
NonHDL: 186.04
Total CHOL/HDL Ratio: 6
Triglycerides: 218 mg/dL — ABNORMAL HIGH (ref 0.0–149.0)
VLDL: 43.6 mg/dL — AB (ref 0.0–40.0)

## 2018-11-27 LAB — LDL CHOLESTEROL, DIRECT: Direct LDL: 155 mg/dL

## 2018-11-27 MED ORDER — ATENOLOL-CHLORTHALIDONE 100-25 MG PO TABS: 1.0000 | ORAL_TABLET | Freq: Every day | ORAL | 3 refills | Status: DC

## 2018-11-27 NOTE — Progress Notes (Signed)
Subjective:    Patient ID: Jade Mcguire, female    DOB: 08/28/1951, 67 y.o.   MRN: [id number]  HPI  The patient presents for annual medicare wellness, complete physical and review of chronic health problems. He/She also has the following acute concerns today:  I have personally reviewed the Medicare Annual Wellness questionnaire and have noted 1. The patient's medical and social history 2. Their use of alcohol, tobacco or illicit drugs 3. Their current medications and supplements 4. The patient's functional ability including ADL's, fall risks, home safety risks and hearing or visual             impairment. 5. Diet and physical activities 6. Evidence for depression or mood disorders 7.         Updated provider list Cognitive evaluation was performed and recorded on pt medicare questionnaire form. The patients weight, height, BMI and visual acuity have been recorded in the chart  I have made referrals, counseling and provided education to the patient based review of the above and I have provided the pt with a written personalized care plan for preventive services.    Documentation of this information was scanned into the electronic record under the media tab.  Hypertension: At goal on atenolol chlorthalidone Using medication without problems or lightheadedness:  none Chest pain with exertion:none Edema:none Short of breath:none Average home BPs: Other issues:  Elevated Cholesterol: On omega fatty acid but no longer on red yeast rice .Marland Kitchen Due for re-eval. Lab Results  Component Value Date   CHOL 207 (H) 09/28/2016   HDL 34.80 (L) 09/28/2016   LDLCALC 145 (H) 04/15/2014   LDLDIRECT 138.0 09/28/2016   TRIG 209.0 (H) 09/28/2016   CHOLHDL 6 09/28/2016  Using medications without problems: Muscle aches:  Diet compliance:  Moderate Exercise: walking 4-5 days a week Other complaints:     Advance directives and end of life planning reviewed in detail with patient and documented  in EMR. Patient given handout on advance care directives if needed. HCPOA and living will updated if needed.  Fall Risk  11/27/2018 11/10/2017 10/27/2016 04/15/2014  Falls in the past year? 0 No No No   Depression screen [facility] 2/9 11/27/2018 11/10/2017 10/27/2016  Decreased Interest 0 0 0  Down, Depressed, Hopeless 0 0 0  PHQ - 2 Score 0 0 0    Hearing Screening   Method: Audiometry   125Hz  250Hz  500Hz  1000Hz  2000Hz  3000Hz  4000Hz  6000Hz  8000Hz   Right ear:   20 20 20  20     Left ear:   20 20 20  20     Vision Screening Comments: Wear glasses-Eye Exam Oct 2019  Social History /Family History/Past Medical History reviewed in detail and updated in EMR if needed.   Blood pressure 120/80, pulse 60, temperature 97.8 F (36.6 C), temperature source Oral, height 5\' 3"  (1.6 m), weight 194 lb 8 oz (88.2 kg).  Review of Systems  Constitutional: Negative for fatigue and fever.  HENT: Negative for congestion.   Eyes: Negative for pain.  Respiratory: Negative for cough and shortness of breath.   Cardiovascular: Negative for chest pain, palpitations and leg swelling.  Gastrointestinal: Negative for abdominal pain.  Genitourinary: Negative for dysuria and vaginal bleeding.  Musculoskeletal: Negative for back pain.  Neurological: Negative for syncope, light-headedness and headaches.  Psychiatric/Behavioral: Negative for dysphoric mood.       Objective:   Physical Exam  Constitutional: Vital signs are normal. She appears well-developed and well-nourished. She is cooperative.  Non-toxic  appearance. She does not appear ill. No distress.  overweight  HENT:  Head: Normocephalic.  Right Ear: Hearing, tympanic membrane, external ear and ear canal normal.  Left Ear: Hearing, tympanic membrane, external ear and ear canal normal.  Nose: Nose normal.  Eyes: Pupils are equal, round, and reactive to light. Conjunctivae, EOM and lids are normal. Lids are everted and swept, no foreign bodies found.  Neck:  Trachea normal and normal range of motion. Neck supple. Carotid bruit is not present. No thyroid mass and no thyromegaly present.  Cardiovascular: Normal rate, regular rhythm, S1 normal, S2 normal, normal heart sounds and intact distal pulses. Exam reveals no gallop.  No murmur heard. Pulmonary/Chest: Effort normal and breath sounds normal. No respiratory distress. She has no wheezes. She has no rhonchi. She has no rales.  Abdominal: Soft. Normal appearance and bowel sounds are normal. She exhibits no distension, no fluid wave, no abdominal bruit and no mass. There is no hepatosplenomegaly. There is no tenderness. There is no rebound, no guarding and no CVA tenderness. No hernia.  Lymphadenopathy:    She has no cervical adenopathy.    She has no axillary adenopathy.  Neurological: She is alert. She has normal strength. No cranial nerve deficit or sensory deficit.  Skin: Skin is warm, dry and intact. No rash noted.  Psychiatric: Her speech is normal and behavior is normal. Judgment normal. Her mood appears not anxious. Cognition and memory are normal. She does not exhibit a depressed mood.          Assessment & Plan:  The patient's preventative maintenance and recommended screening tests for an annual wellness exam were reviewed in full today. Brought up to date unless services declined.  Counselled on the importance of diet, exercise, and its role in overall health and mortality. The patient's FH and SH was reviewed, including their home life, tobacco status, and drug and alcohol status.   Vaccines:Uptodate,  Given today flu Mammo:04/2017 stable, plan q 2 years. DEXA:05/2014 stable osteopenia, mother with osteoporosis Vit D nml. Repeat in 5 years. Pap/DVE: partial hysterectomy for fibroids, No fam hx ovarian Ca. No DVE indicated. Colon: 10/2012 Dr. Fuller Plan, repeat in 10 years Former smoker, 33pack year hx, quit 2013. No daily cough, no SOB. Last spiro nml 2012 She refuses lung  cancer CT screening again this year. She  Plans to do when age 70. Hep C:  done

## 2018-11-27 NOTE — Assessment & Plan Note (Signed)
Encouraged exercise, weight loss, healthy eating habits. ? ?

## 2018-11-27 NOTE — Assessment & Plan Note (Signed)
Due for re-eval. 

## 2018-11-27 NOTE — Assessment & Plan Note (Signed)
Well controlled. Continue current medication.  

## 2018-11-27 NOTE — Patient Instructions (Addendum)
Please stop at the lab to have labs drawn. Please stop at the front desk to set up referral for bone density and mammogram in 04/2019

## 2018-11-28 ENCOUNTER — Telehealth: Payer: Self-pay | Admitting: *Deleted

## 2018-11-28 MED ORDER — ATORVASTATIN CALCIUM 10 MG PO TABS: 10.0000 mg | ORAL_TABLET | Freq: Every day | ORAL | 3 refills | Status: DC

## 2018-11-28 NOTE — Telephone Encounter (Signed)
Jade Mcguire notified as instructed by telephone.  She is agreeable to starting atorvastatin 10 mg daily.  Rx sent to [facility] in [place].  Patient will call back for lab appointment in 3 months to recheck her cholesterol.

## 2018-11-28 NOTE — Telephone Encounter (Signed)
-----   Message from Jinny Sanders, MD sent at 11/27/2018  5:28 PM EST ----- Call  Poor control cholesterol... Recommend statin medication at low dose such as atorvastatin 10 mg daily... Recheck in 3 months.  if refuses would suggest red yeast rice 600 2 tabs BID with labs in 3 months. Prediabetes stable, stable slightly decreased kidney function.Marland Kitchen Keep up with fluids and avoid  NSAIDs.

## 2018-12-04 ENCOUNTER — Telehealth: Payer: Self-pay | Admitting: Family Medicine

## 2018-12-04 NOTE — Telephone Encounter (Signed)
Butch Penny can you change pt mammogram to [id number] thanks

## 2018-12-04 NOTE — Addendum Note (Signed)
Addended by: Carter Kitten on: 12/04/2018 11:41 AM   Modules accepted: Orders

## 2018-12-04 NOTE — Telephone Encounter (Signed)
Order changed as requested.

## 2019-01-17 ENCOUNTER — Ambulatory Visit
Admission: RE | Admit: 2019-01-17 | Discharge: 2019-01-17 | Disposition: A | Discharge status: Home / Self Care | Payer: Medicare Other | Source: Ambulatory Visit | Attending: Family Medicine | Admitting: Family Medicine

## 2019-01-17 ENCOUNTER — Encounter: Payer: Self-pay | Admitting: *Deleted

## 2019-01-17 DIAGNOSIS — Z1231 Encounter for screening mammogram for malignant neoplasm of breast: Secondary | ICD-10-CM | POA: Insufficient documentation

## 2019-01-17 DIAGNOSIS — M8580 Other specified disorders of bone density and structure, unspecified site: Secondary | ICD-10-CM

## 2019-01-17 DIAGNOSIS — M8589 Other specified disorders of bone density and structure, multiple sites: Secondary | ICD-10-CM | POA: Diagnosis not present

## 2019-04-06 IMAGING — MG DIGITAL SCREENING BILATERAL MAMMOGRAM WITH TOMO AND CAD
8 series · 8 of 24 positions shown · non-contrast
Comparison: Previous exam(s).

CLINICAL DATA: Screening.

EXAM:
DIGITAL SCREENING BILATERAL MAMMOGRAM WITH TOMO AND CAD

[L MLO synth-2D]
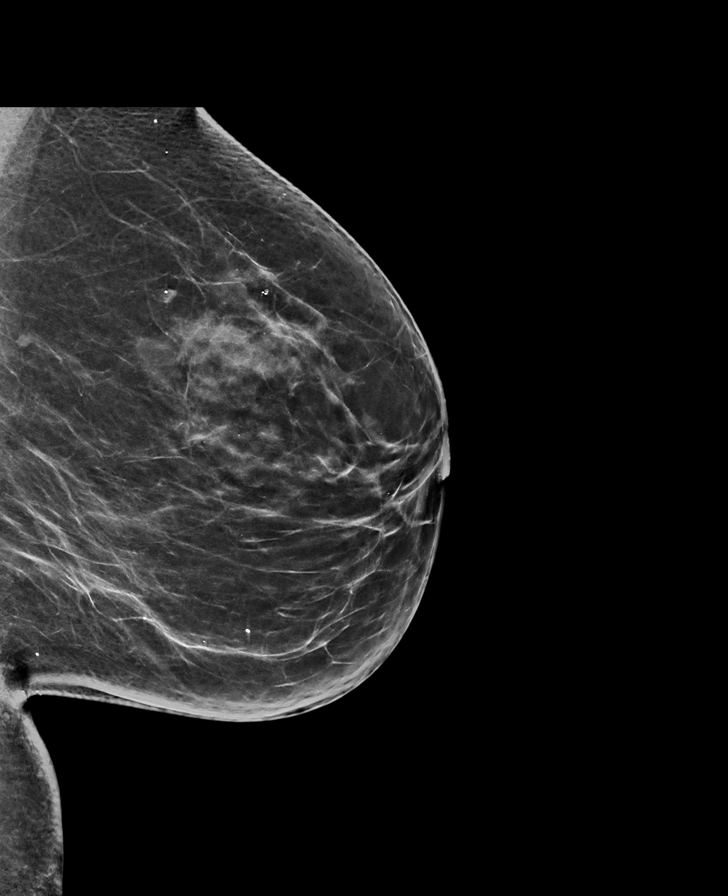

[R CC synth-2D]
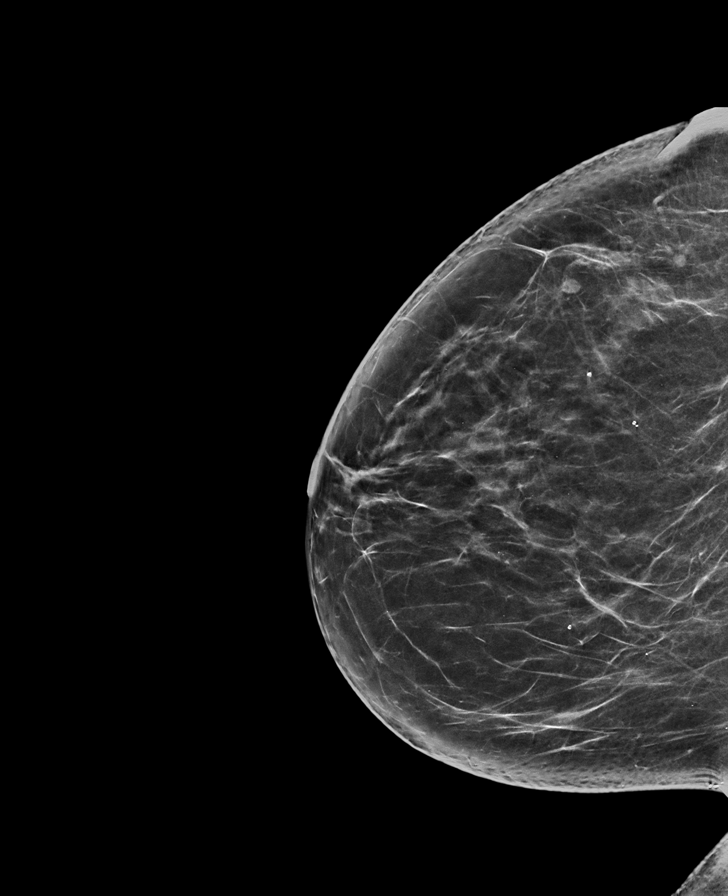

[R MLO synth-2D]
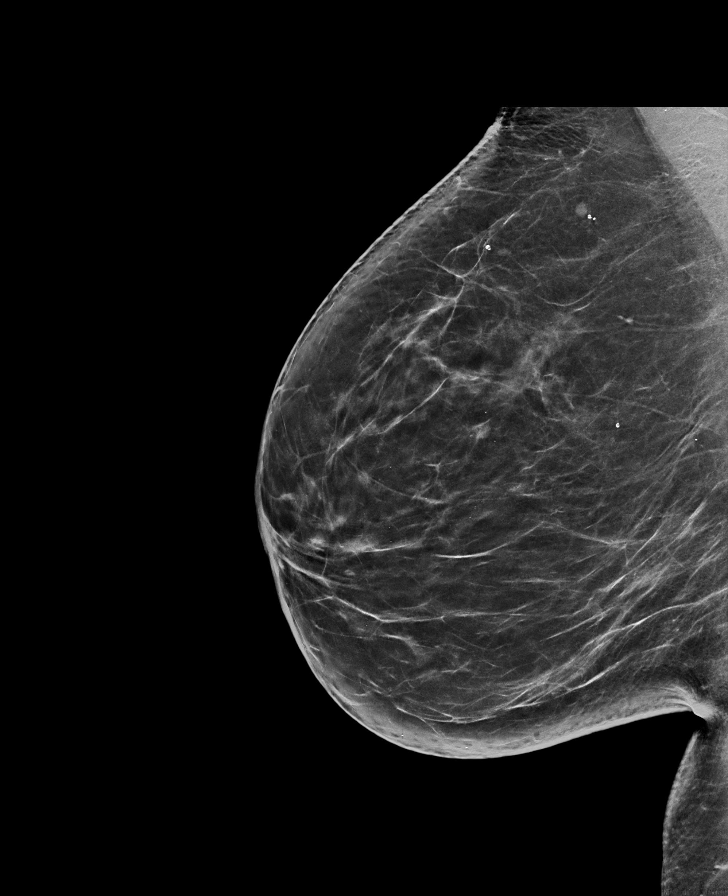

[L CC synth-2D]
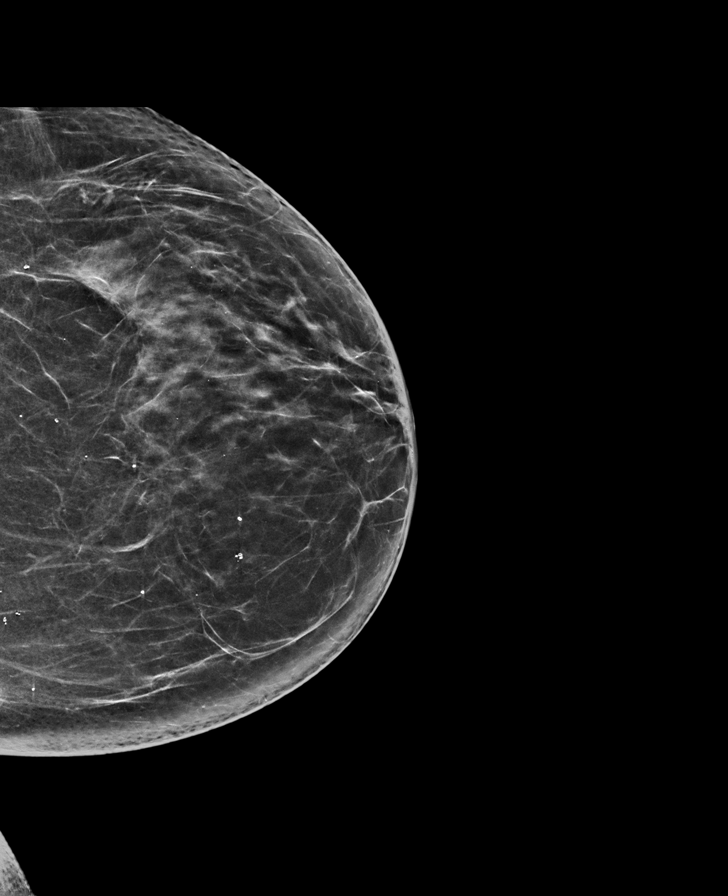

[R CC tomo · tomo slice 37/73.0]
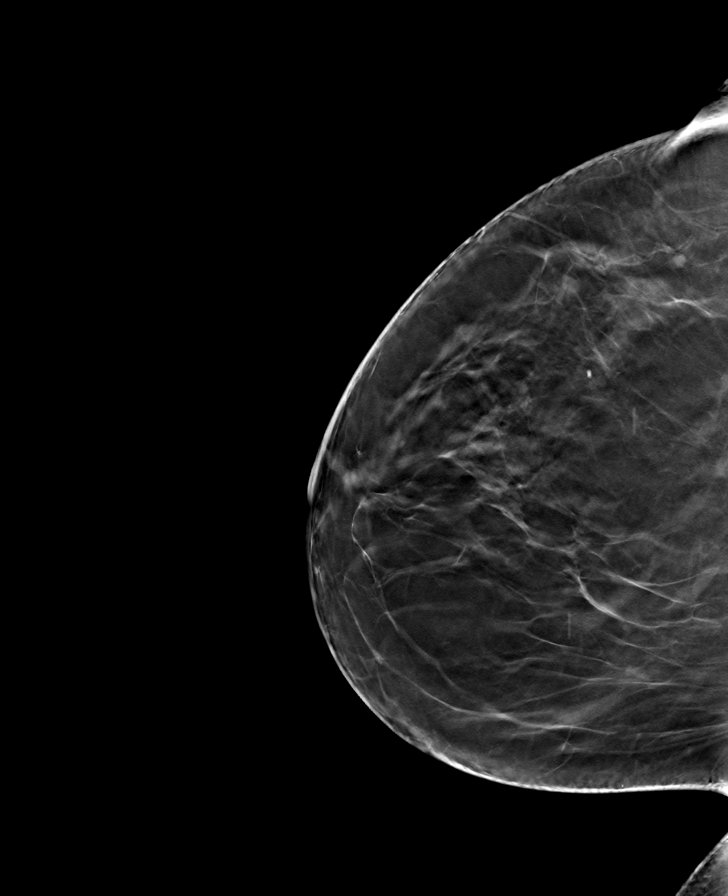

[R MLO tomo · tomo slice 39/76.0]
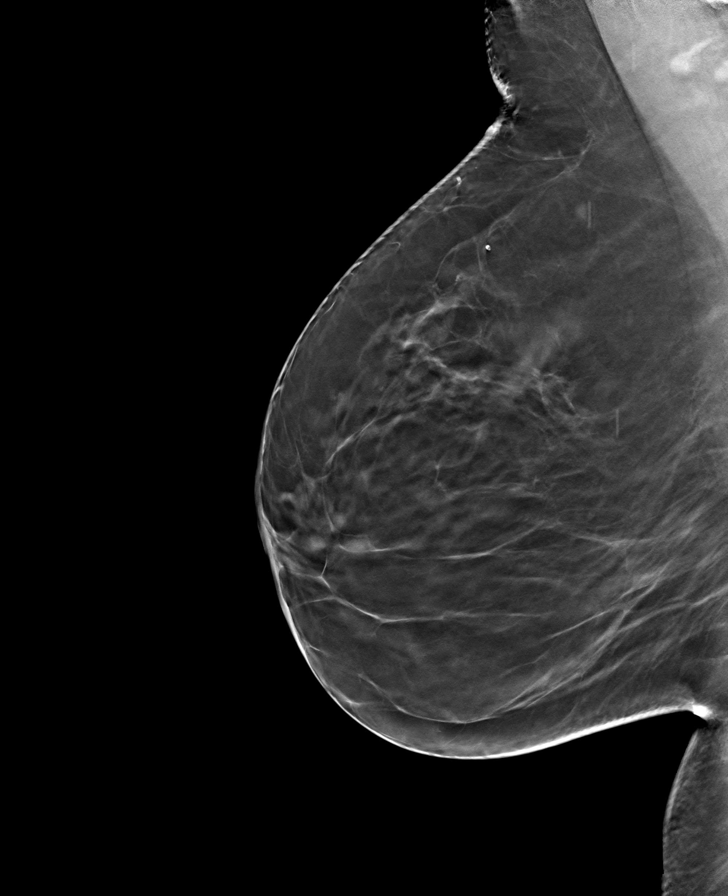

[L MLO tomo · tomo slice 37/74.0]
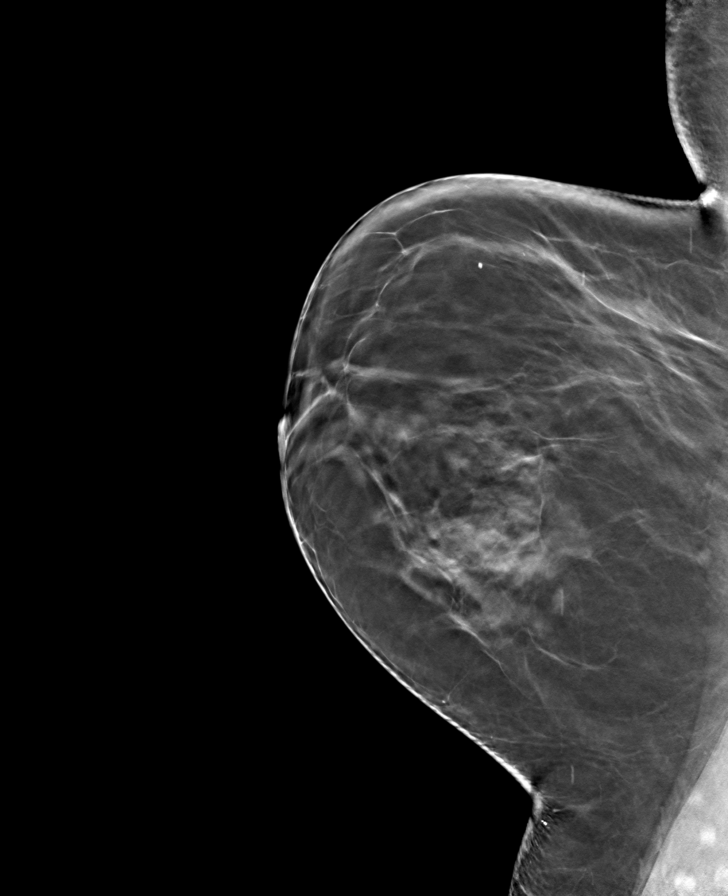

[L CC tomo · tomo slice 35/70.0]
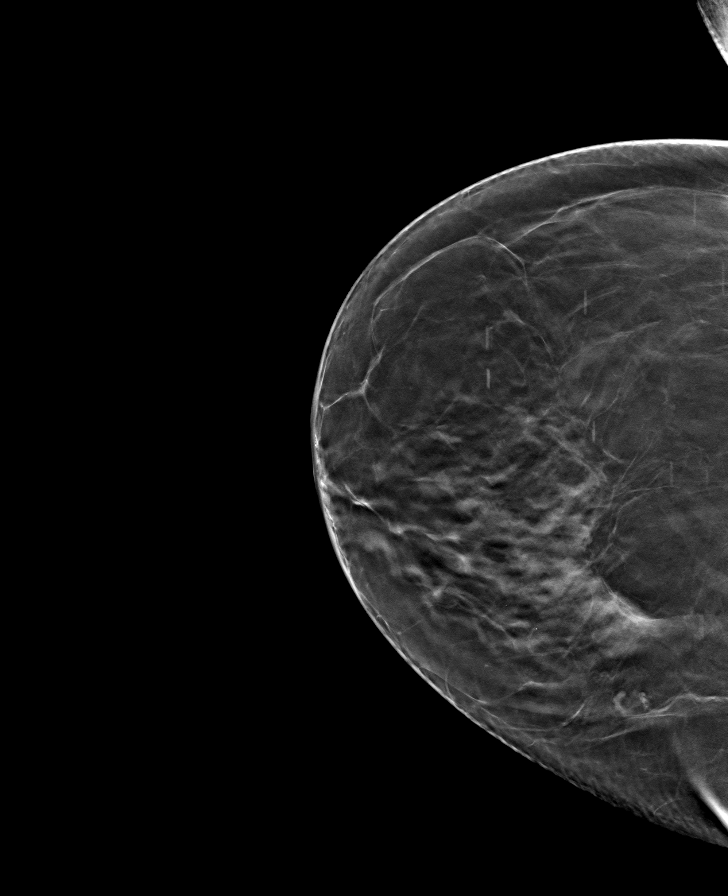

[8 of 24 positions shown; findings below may reference images not displayed]

ACR Breast Density Category c: The breast tissue is heterogeneously
dense, which may obscure small masses.
FINDINGS: There are no findings suspicious for malignancy. Images were
processed with CAD.
IMPRESSION: No mammographic evidence of malignancy. A result letter of this
screening mammogram will be mailed directly to the patient.

RECOMMENDATION:
Screening mammogram in one year. (Code:[id number]-U-LHB)

BI-RADS CATEGORY  1: Negative.

## 2019-10-24 ENCOUNTER — Ambulatory Visit: Payer: Medicare Other

## 2019-12-05 ENCOUNTER — Other Ambulatory Visit: Payer: Self-pay | Admitting: Family Medicine

## 2019-12-05 NOTE — Telephone Encounter (Signed)
Please schedule for MWV with Nurse and CPE with Dr. Diona Browner.  Send back to me once scheduled to refill her medication.

## 2019-12-13 NOTE — Telephone Encounter (Signed)
Labs 1/12 cpx 1/19 Pt didn't want to schedule with medicare wellness nurse

## 2020-01-06 ENCOUNTER — Telehealth: Payer: Self-pay | Admitting: Family Medicine

## 2020-01-06 DIAGNOSIS — R7303 Prediabetes: Secondary | ICD-10-CM

## 2020-01-06 DIAGNOSIS — E7800 Pure hypercholesterolemia, unspecified: Secondary | ICD-10-CM

## 2020-01-06 NOTE — Telephone Encounter (Signed)
-----   Message from Cloyd Stagers, RT sent at 12/31/2019  3:29 PM EST ----- Regarding: Lab Orders for Tuesday 1.11.2021 Please place lab orders for Tuesday 1.11.2021, office visit for physical on Tuesday 1.19.2021 Thank you, Dyke Maes RT(R)

## 2020-01-06 NOTE — Telephone Encounter (Signed)
-----   Message from Ellamae Sia sent at 12/31/2019  4:04 PM EST ----- Regarding: lab orders for Tuesday, 1.12.21 Patient is scheduled for CPX labs, please order future labs, Thanks , Karna Christmas

## 2020-01-07 ENCOUNTER — Other Ambulatory Visit (INDEPENDENT_AMBULATORY_CARE_PROVIDER_SITE_OTHER): Payer: Medicare Other

## 2020-01-07 ENCOUNTER — Other Ambulatory Visit: Payer: Self-pay

## 2020-01-07 DIAGNOSIS — R7303 Prediabetes: Secondary | ICD-10-CM

## 2020-01-07 DIAGNOSIS — E7800 Pure hypercholesterolemia, unspecified: Secondary | ICD-10-CM | POA: Diagnosis not present

## 2020-01-07 LAB — COMPREHENSIVE METABOLIC PANEL
ALT: 8 U/L (ref 0–35)
AST: 17 U/L (ref 0–37)
Albumin: 4.3 g/dL (ref 3.5–5.2)
Alkaline Phosphatase: 67 U/L (ref 39–117)
BUN: 16 mg/dL (ref 6–23)
CO2: 32 mEq/L (ref 19–32)
Calcium: 10 mg/dL (ref 8.4–10.5)
Chloride: 97 mEq/L (ref 96–112)
Creatinine, Ser: 0.96 mg/dL (ref 0.40–1.20)
GFR: 57.29 mL/min — ABNORMAL LOW (ref >60.00)
Glucose, Bld: 120 mg/dL — ABNORMAL HIGH (ref 70–99)
Potassium: 3.6 mEq/L (ref 3.5–5.1)
Sodium: 137 mEq/L (ref 135–145)
Total Bilirubin: 0.6 mg/dL (ref 0.2–1.2)
Total Protein: 7.2 g/dL (ref 6.0–8.3)

## 2020-01-07 LAB — LIPID PANEL
Cholesterol: 197 mg/dL (ref 0–200)
HDL: 35.2 mg/dL — ABNORMAL LOW (ref >39.00)
NonHDL: 162.21
Total CHOL/HDL Ratio: 6
Triglycerides: 263 mg/dL — ABNORMAL HIGH (ref 0.0–149.0)
VLDL: 52.6 mg/dL — ABNORMAL HIGH (ref 0.0–40.0)

## 2020-01-07 LAB — HEMOGLOBIN A1C: Hgb A1c MFr Bld: 5.9 % (ref 4.6–6.5)

## 2020-01-07 LAB — LDL CHOLESTEROL, DIRECT: Direct LDL: 127 mg/dL

## 2020-01-07 NOTE — Progress Notes (Signed)
No critical labs need to be addressed urgently. We will discuss labs in detail at upcoming office visit.   

## 2020-01-14 ENCOUNTER — Ambulatory Visit (INDEPENDENT_AMBULATORY_CARE_PROVIDER_SITE_OTHER): Payer: Medicare Other | Admitting: Family Medicine

## 2020-01-14 ENCOUNTER — Other Ambulatory Visit: Payer: Self-pay

## 2020-01-14 ENCOUNTER — Encounter: Payer: Self-pay | Admitting: Family Medicine

## 2020-01-14 VITALS — BP 130/80 | HR 55 | Temp 97.3°F | Ht 63.25 in | Wt 194.5 lb

## 2020-01-14 DIAGNOSIS — E669 Obesity, unspecified: Secondary | ICD-10-CM

## 2020-01-14 DIAGNOSIS — I10 Essential (primary) hypertension: Secondary | ICD-10-CM

## 2020-01-14 DIAGNOSIS — E7800 Pure hypercholesterolemia, unspecified: Secondary | ICD-10-CM | POA: Diagnosis not present

## 2020-01-14 DIAGNOSIS — Z0000 Encounter for general adult medical examination without abnormal findings: Secondary | ICD-10-CM

## 2020-01-14 DIAGNOSIS — R7303 Prediabetes: Secondary | ICD-10-CM

## 2020-01-14 NOTE — Progress Notes (Signed)
Chief Complaint  Patient presents with  . Medicare Wellness    History of Present Illness: HPI  The patient presents for annual medicare wellness, complete physical and review of chronic health problems. He/She also has the following acute concerns today: none  I have personally reviewed the Medicare Annual Wellness questionnaire and have noted 1. The patient's medical and social history 2. Their use of alcohol, tobacco or illicit drugs 3. Their current medications and supplements 4. The patient's functional ability including ADL's, fall risks, home safety risks and hearing or visual             impairment. 5. Diet and physical activities 6. Evidence for depression or mood disorders 7.         Updated provider list Cognitive evaluation was performed and recorded on pt medicare questionnaire form. The patients weight, height, BMI and visual acuity have been recorded in the chart  I have made referrals, counseling and provided education to the patient based review of the above and I have provided the pt with a written personalized care plan for preventive services.   Documentation of this information was scanned into the electronic record under the media tab.   Advance directives and end of life planning reviewed in detail with patient and documented in EMR. Patient given handout on advance care directives if needed. HCPOA and living will updated if needed.   Hearing Screening   Method: Audiometry   125Hz  250Hz  500Hz  1000Hz  2000Hz  3000Hz  4000Hz  6000Hz  8000Hz   Right ear:   20 20 20  20     Left ear:   20 20 20  20       Visual Acuity Screening   Right eye Left eye Both eyes  Without correction: 20/70 20/70 20/50   With correction:       Fall Risk  01/14/2020 11/27/2018 11/10/2017 10/27/2016 04/15/2014  Falls in the past year? 0 0 No No No   PHQ9 SCORE ONLY 01/14/2020 11/27/2018 11/10/2017  Score 0 0 0    Hypertension:    At goal on tenoretic BP Readings from Last 3 Encounters:   01/14/20 130/80  11/27/18 120/80  11/10/17 118/80  Using medication without problems or lightheadedness: none Chest pain with exertion:none Edema:none Short of breath:none Average home BPs: Other issues:  Elevated Cholesterol:  LDL not at goal on atorvastatin 10 mg daily, but LDL better than in past. Lab Results  Component Value Date   CHOL 197 01/07/2020   HDL 35.20 (L) 01/07/2020   LDLCALC 145 (H) 04/15/2014   LDLDIRECT 127.0 01/07/2020   TRIG 263.0 (H) 01/07/2020   CHOLHDL 6 01/07/2020  Using medications without problems: none Muscle aches: none Diet compliance:moderate Exercise: walking some Other complaints:   prediabetes  Stable control Lab Results  Component Value Date   HGBA1C 5.9 01/07/2020      This visit occurred during the SARS-CoV-2 public health emergency.  Safety protocols were in place, including screening questions prior to the visit, additional usage of staff PPE, and extensive cleaning of exam room while observing appropriate contact time as indicated for disinfecting solutions.   COVID 19 screen:  No recent travel or known exposure to COVID19 The patient denies respiratory symptoms of COVID 19 at this time. The importance of social distancing was discussed today.     Review of Systems  Constitutional: Negative for chills and fever.  HENT: Negative for congestion and ear pain.   Eyes: Negative for pain and redness.  Respiratory: Negative for cough and shortness of  breath.   Cardiovascular: Negative for chest pain, palpitations and leg swelling.  Gastrointestinal: Negative for abdominal pain, blood in stool, constipation, diarrhea, nausea and vomiting.  Genitourinary: Negative for dysuria.  Musculoskeletal: Negative for falls and myalgias.  Skin: Negative for rash.  Neurological: Negative for dizziness.  Psychiatric/Behavioral: Negative for depression. The patient is not nervous/anxious.       Past Medical History:  Diagnosis Date  .  Palpitations    pt denies any at this time.  . Tobacco use disorder   . Unspecified essential hypertension   . Unspecified viral infection, in conditions classified elsewhere and of unspecified site     reports that she has quit smoking. Her smoking use included cigarettes. She has never used smokeless tobacco. She reports that she does not drink alcohol or use drugs.   Current Outpatient Medications:  .  Ascorbic Acid (VITAMIN C PO), Take 1 tablet by mouth daily., Disp: , Rfl:  .  atenolol-chlorthalidone (TENORETIC) 100-25 MG tablet, TAKE 1 TABLET BY MOUTH EVERY DAY, Disp: 90 tablet, Rfl: 0 .  atorvastatin (LIPITOR) 10 MG tablet, TAKE 1 TABLET BY MOUTH EVERY DAY, Disp: 90 tablet, Rfl: 0 .  cholecalciferol (VITAMIN D) 400 UNITS TABS tablet, Take 400 Units by mouth 2 (two) times daily., Disp: , Rfl:  .  Glucosamine Sulfate 1000 MG TABS, Take 1 tablet by mouth daily., Disp: , Rfl:  .  Multiple Vitamin (MULTIVITAMIN) tablet, Take 1 tablet by mouth daily., Disp: , Rfl:  .  Omega-3 Fatty Acids (FISH OIL) 1000 MG CAPS, Take 1 capsule by mouth daily., Disp: , Rfl:  .  Red Yeast Rice 600 MG CAPS, Take 1,200 mg by mouth 2 (two) times daily., Disp: , Rfl:    Observations/Objective: Blood pressure 130/80, pulse (!) 55, temperature (!) 97.3 F (36.3 C), temperature source Temporal, height 5' 3.25" (1.607 m), weight 194 lb 8 oz (88.2 kg), SpO2 97 %.  Physical Exam Constitutional:      General: She is not in acute distress.    Appearance: Normal appearance. She is well-developed. She is not ill-appearing or toxic-appearing.  HENT:     Head: Normocephalic.     Right Ear: Hearing, tympanic membrane, ear canal and external ear normal.     Left Ear: Hearing, tympanic membrane, ear canal and external ear normal.     Nose: Nose normal.  Eyes:     General: Lids are normal. Lids are everted, no foreign bodies appreciated.     Conjunctiva/sclera: Conjunctivae normal.     Pupils: Pupils are equal, round,  and reactive to light.  Neck:     Thyroid: No thyroid mass or thyromegaly.     Vascular: No carotid bruit.     Trachea: Trachea normal.  Cardiovascular:     Rate and Rhythm: Normal rate and regular rhythm.     Heart sounds: Normal heart sounds, S1 normal and S2 normal. No murmur. No gallop.   Pulmonary:     Effort: Pulmonary effort is normal. No respiratory distress.     Breath sounds: Normal breath sounds. No wheezing, rhonchi or rales.  Abdominal:     General: Bowel sounds are normal. There is no distension or abdominal bruit.     Palpations: Abdomen is soft. There is no fluid wave or mass.     Tenderness: There is no abdominal tenderness. There is no guarding or rebound.     Hernia: No hernia is present.  Musculoskeletal:     Cervical back: Normal range  of motion and neck supple.  Lymphadenopathy:     Cervical: No cervical adenopathy.  Skin:    General: Skin is warm and dry.     Findings: No rash.  Neurological:     Mental Status: She is alert.     Cranial Nerves: No cranial nerve deficit.     Sensory: No sensory deficit.  Psychiatric:        Mood and Affect: Mood is not anxious or depressed.        Speech: Speech normal.        Behavior: Behavior normal. Behavior is cooperative.        Judgment: Judgment normal.      Assessment and Plan The patient's preventative maintenance and recommended screening tests for an annual wellness exam were reviewed in full today. Brought up to date unless services declined.  Counselled on the importance of diet, exercise, and its role in overall health and mortality. The patient's FH and SH was reviewed, including their home life, tobacco status, and drug and alcohol status.   Vaccines:Uptodate flu Mammo:12/2018 stable, plan q 2 years. DEXA 12/2018 stable osteopenia, mother with osteoporosis Vit D nml. Repeat in 5 years. Pap/DVE: partial hysterectomy for fibroids, No fam hx ovarian Ca. No DVE indicated. Colon: 10/2012 Dr. Fuller Plan,  repeat in 10 years Former smoker, 33pack year hx, [phone]. No daily cough, no SOB. Last spiro nml 2012 She refuses lung cancer CT screening again this year.. refused again 2021. Hep C:done     Eliezer Lofts, MD

## 2020-01-14 NOTE — Assessment & Plan Note (Signed)
Well controlled. Continue current medication.  

## 2020-01-14 NOTE — Assessment & Plan Note (Signed)
LDL not at goal on statin.

## 2020-01-14 NOTE — Patient Instructions (Addendum)
Work on low carb, low fat diet.  Increase exercise to 3-5 days a week.  Can try coq10 for muscle ache from the statin.  Can increase to 2 tabs atorvastatin daily.  Can return for cholesterol  recheck in 3 months.

## 2020-01-14 NOTE — Assessment & Plan Note (Signed)
Wt Readings from Last 3 Encounters:  01/14/20 194 lb 8 oz (88.2 kg)  11/27/18 194 lb 8 oz (88.2 kg)  11/10/17 188 lb 8 oz (85.5 kg)   Weight gain since  Last OV. Encouraged exercise, weight loss, healthy eating habits.

## 2020-01-14 NOTE — Assessment & Plan Note (Signed)
Stable control. 

## 2020-03-10 ENCOUNTER — Other Ambulatory Visit: Payer: Self-pay | Admitting: Family Medicine

## 2020-03-14 ENCOUNTER — Other Ambulatory Visit: Payer: Self-pay | Admitting: Family Medicine

## 2020-10-01 ENCOUNTER — Other Ambulatory Visit: Payer: Self-pay | Admitting: Family Medicine

## 2020-12-30 ENCOUNTER — Other Ambulatory Visit: Payer: Self-pay | Admitting: Family Medicine

## 2021-03-23 ENCOUNTER — Telehealth: Payer: Self-pay

## 2021-03-23 ENCOUNTER — Ambulatory Visit (INDEPENDENT_AMBULATORY_CARE_PROVIDER_SITE_OTHER): Payer: Medicare Other

## 2021-03-23 ENCOUNTER — Other Ambulatory Visit: Payer: Self-pay

## 2021-03-23 DIAGNOSIS — Z0000 Encounter for general adult medical examination without abnormal findings: Secondary | ICD-10-CM

## 2021-03-23 NOTE — Patient Instructions (Signed)
Jade Mcguire , Thank you for taking time to come for your Medicare Wellness Visit. I appreciate your ongoing commitment to your health goals. Please review the following plan we discussed and let me know if I can assist you in the future.   Screening recommendations/referrals: Colonoscopy: Up to date, completed 11/13/2012, due 10/2022 Mammogram: due, Patient will call and schedule appointment  Bone Density: due, Patient will call and schedule appointment  Recommended yearly ophthalmology/optometry visit for glaucoma screening and checkup Recommended yearly dental visit for hygiene and checkup  Vaccinations: Influenza vaccine: declined Pneumococcal vaccine: Completed series Tdap vaccine: Up to date, completed 08/30/2011, due 08/2021 Shingles vaccine: due, check with your insurance regarding coverage if interested    Covid-19:declined  Advanced directives:Advance directive discussed with you today. Even though you declined this today please call our office should you change your mind and we can give you the proper paperwork for you to fill out.   Conditions/risks identified: hypertension, hyperlipidemia   Next appointment: Follow up in one year for your annual wellness visit    Preventive Care 72 Years and Older, Female Preventive care refers to lifestyle choices and visits with your health care provider that can promote health and wellness. What does preventive care include?  A yearly physical exam. This is also called an annual well check.  Dental exams once or twice a year.  Routine eye exams. Ask your health care provider how often you should have your eyes checked.  Personal lifestyle choices, including:  Daily care of your teeth and gums.  Regular physical activity.  Eating a healthy diet.  Avoiding tobacco and drug use.  Limiting alcohol use.  Practicing safe sex.  Taking low-dose aspirin every day.  Taking vitamin and mineral supplements as recommended by your  health care provider. What happens during an annual well check? The services and screenings done by your health care provider during your annual well check will depend on your age, overall health, lifestyle risk factors, and family history of disease. Counseling  Your health care provider may ask you questions about your:  Alcohol use.  Tobacco use.  Drug use.  Emotional well-being.  Home and relationship well-being.  Sexual activity.  Eating habits.  History of falls.  Memory and ability to understand (cognition).  Work and work Statistician.  Reproductive health. Screening  You may have the following tests or measurements:  Height, weight, and BMI.  Blood pressure.  Lipid and cholesterol levels. These may be checked every 5 years, or more frequently if you are over 35 years old.  Skin check.  Lung cancer screening. You may have this screening every year starting at age 72 if you have a 30-pack-year history of smoking and currently smoke or have quit within the past 15 years.  Fecal occult blood test (FOBT) of the stool. You may have this test every year starting at age 20.  Flexible sigmoidoscopy or colonoscopy. You may have a sigmoidoscopy every 5 years or a colonoscopy every 10 years starting at age 72.  Hepatitis C blood test.  Hepatitis B blood test.  Sexually transmitted disease (STD) testing.  Diabetes screening. This is done by checking your blood sugar (glucose) after you have not eaten for a while (fasting). You may have this done every 1-3 years.  Bone density scan. This is done to screen for osteoporosis. You may have this done starting at age 26.  Mammogram. This may be done every 1-2 years. Talk to your health care provider about  how often you should have regular mammograms. Talk with your health care provider about your test results, treatment options, and if necessary, the need for more tests. Vaccines  Your health care provider may recommend  certain vaccines, such as:  Influenza vaccine. This is recommended every year.  Tetanus, diphtheria, and acellular pertussis (Tdap, Td) vaccine. You may need a Td booster every 10 years.  Zoster vaccine. You may need this after age 85.  Pneumococcal 13-valent conjugate (PCV13) vaccine. One dose is recommended after age 78.  Pneumococcal polysaccharide (PPSV23) vaccine. One dose is recommended after age 37. Talk to your health care provider about which screenings and vaccines you need and how often you need them. This information is not intended to replace advice given to you by your health care provider. Make sure you discuss any questions you have with your health care provider. Document Released: 01/08/2016 Document Revised: 08/31/2016 Document Reviewed: 10/13/2015 Elsevier Interactive Patient Education  2017 [employer] Prevention in the Home Falls can cause injuries. They can happen to people of all ages. There are many things you can do to make your home safe and to help prevent falls. What can I do on the outside of my home?  Regularly fix the edges of walkways and driveways and fix any cracks.  Remove anything that might make you trip as you walk through a door, such as a raised step or threshold.  Trim any bushes or trees on the path to your home.  Use bright outdoor lighting.  Clear any walking paths of anything that might make someone trip, such as rocks or tools.  Regularly check to see if handrails are loose or broken. Make sure that both sides of any steps have handrails.  Any raised decks and porches should have guardrails on the edges.  Have any leaves, snow, or ice cleared regularly.  Use sand or salt on walking paths during winter.  Clean up any spills in your garage right away. This includes oil or grease spills. What can I do in the bathroom?  Use night lights.  Install grab bars by the toilet and in the tub and shower. Do not use towel bars as  grab bars.  Use non-skid mats or decals in the tub or shower.  If you need to sit down in the shower, use a plastic, non-slip stool.  Keep the floor dry. Clean up any water that spills on the floor as soon as it happens.  Remove soap buildup in the tub or shower regularly.  Attach bath mats securely with double-sided non-slip rug tape.  Do not have throw rugs and other things on the floor that can make you trip. What can I do in the bedroom?  Use night lights.  Make sure that you have a light by your bed that is easy to reach.  Do not use any sheets or blankets that are too big for your bed. They should not hang down onto the floor.  Have a firm chair that has side arms. You can use this for support while you get dressed.  Do not have throw rugs and other things on the floor that can make you trip. What can I do in the kitchen?  Clean up any spills right away.  Avoid walking on wet floors.  Keep items that you use a lot in easy-to-reach places.  If you need to reach something above you, use a strong step stool that has a grab bar.  Keep  electrical cords out of the way.  Do not use floor polish or wax that makes floors slippery. If you must use wax, use non-skid floor wax.  Do not have throw rugs and other things on the floor that can make you trip. What can I do with my stairs?  Do not leave any items on the stairs.  Make sure that there are handrails on both sides of the stairs and use them. Fix handrails that are broken or loose. Make sure that handrails are as long as the stairways.  Check any carpeting to make sure that it is firmly attached to the stairs. Fix any carpet that is loose or worn.  Avoid having throw rugs at the top or bottom of the stairs. If you do have throw rugs, attach them to the floor with carpet tape.  Make sure that you have a light switch at the top of the stairs and the bottom of the stairs. If you do not have them, ask someone to add them  for you. What else can I do to help prevent falls?  Wear shoes that:  Do not have high heels.  Have rubber bottoms.  Are comfortable and fit you well.  Are closed at the toe. Do not wear sandals.  If you use a stepladder:  Make sure that it is fully opened. Do not climb a closed stepladder.  Make sure that both sides of the stepladder are locked into place.  Ask someone to hold it for you, if possible.  Clearly mark and make sure that you can see:  Any grab bars or handrails.  First and last steps.  Where the edge of each step is.  Use tools that help you move around (mobility aids) if they are needed. These include:  Canes.  Walkers.  Scooters.  Crutches.  Turn on the lights when you go into a dark area. Replace any light bulbs as soon as they burn out.  Set up your furniture so you have a clear path. Avoid moving your furniture around.  If any of your floors are uneven, fix them.  If there are any pets around you, be aware of where they are.  Review your medicines with your doctor. Some medicines can make you feel dizzy. This can increase your chance of falling. Ask your doctor what other things that you can do to help prevent falls. This information is not intended to replace advice given to you by your health care provider. Make sure you discuss any questions you have with your health care provider. Document Released: 10/08/2009 Document Revised: 05/19/2016 Document Reviewed: 01/16/2015 Elsevier Interactive Patient Education  2017 [employer].

## 2021-03-23 NOTE — Progress Notes (Signed)
Subjective:   Jade Mcguire is a 70 y.o. female who presents for Medicare Annual (Subsequent) preventive examination.  Review of Systems: N/A      I connected with the patient today by telephone and verified that I am speaking with the correct person using two identifiers. Location patient: home Location nurse: work Persons participating in the telephone visit: patient, nurse.   I discussed the limitations, risks, security and privacy concerns of performing an evaluation and management service by telephone and the availability of in person appointments. I also discussed with the patient that there may be a patient responsible charge related to this service. The patient expressed understanding and verbally consented to this telephonic visit.        Cardiac Risk Factors include: advanced age (>17men, >80 women);hypertension;Other (see comment), Risk factor comments: hyperlipidemia     Objective:    Today's Vitals   03/23/21 1200  PainSc: 5    There is no height or weight on file to calculate BMI.  Advanced Directives 03/23/2021 10/27/2016 10/27/2016  Does Patient Have a Medical Advance Directive? No No No  Would patient like information on creating a medical advance directive? No - Patient declined Yes - Educational materials given -    Current Medications (verified) Outpatient Encounter Medications as of 03/23/2021  Medication Sig  . Ascorbic Acid (VITAMIN C PO) Take 1 tablet by mouth daily.  Marland Kitchen atenolol-chlorthalidone (TENORETIC) 100-25 MG tablet TAKE 1 TABLET BY MOUTH EVERY DAY  . cholecalciferol (VITAMIN D) 400 UNITS TABS tablet Take 400 Units by mouth 2 (two) times daily.  . Glucosamine Sulfate 1000 MG TABS Take 1 tablet by mouth daily.  . Multiple Vitamin (MULTIVITAMIN) tablet Take 1 tablet by mouth daily.  . Omega-3 Fatty Acids (FISH OIL) 1000 MG CAPS Take 1 capsule by mouth daily.  . Red Yeast Rice 600 MG CAPS Take 1,200 mg by mouth 2 (two) times daily.  Marland Kitchen atorvastatin  (LIPITOR) 10 MG tablet TAKE 1 TABLET BY MOUTH EVERY DAY (Patient not taking: No sig reported)   No facility-administered encounter medications on file as of 03/23/2021.    Allergies (verified) Patient has no known allergies.   History: Past Medical History:  Diagnosis Date  . Palpitations    pt denies any at this time.  . Tobacco use disorder   . Unspecified essential hypertension   . Unspecified viral infection, in conditions classified elsewhere and of unspecified site    Past Surgical History:  Procedure Laterality Date  . ABDOMINAL HYSTERECTOMY    . CHOLECYSTECTOMY    . COLONOSCOPY     last 2003  . POLYPECTOMY    . TUBAL LIGATION    . VEIN LIGATION AND STRIPPING     Family History  Problem Relation Age of Onset  . Hypertension Mother   . Osteoporosis Mother   . Heart attack Father   . Emphysema Father   . Diabetes Son   . Cancer Maternal Aunt   . Breast cancer Sister 32  . Colon cancer Neg Hx    Social History   Socioeconomic History  . Marital status: Married    Spouse name: Not on file  . Number of children: 2  . Years of education: Not on file  . Highest education level: Not on file  Occupational History  . Occupation: AGENT    Employer: [employer]  Tobacco Use  . Smoking status: Former Smoker    Types: Cigarettes  . Smokeless tobacco: Never Used  .  Tobacco comment: using electronic cigarettes-quit smoking in march 2013  Substance and Sexual Activity  . Alcohol use: No  . Drug use: No  . Sexual activity: Not on file  Other Topics Concern  . Not on file  Social History Narrative   Reviewed 2015    Full code, no living will, no HCPOA.   Social Determinants of Health   Financial Resource Strain: Low Risk   . Difficulty of Paying Living Expenses: Not hard at all  Food Insecurity: No Food Insecurity  . Worried About Charity fundraiser in the Last Year: Never true  . Ran Out of Food in the Last Year: Never true  Transportation Needs: No  Transportation Needs  . Lack of Transportation (Medical): No  . Lack of Transportation (Non-Medical): No  Physical Activity: Sufficiently Active  . Days of Exercise per Week: 7 days  . Minutes of Exercise per Session: 30 min  Stress: No Stress Concern Present  . Feeling of Stress : Not at all  Social Connections: Not on file    Tobacco Counseling Counseling given: Not Answered Comment: using electronic cigarettes-quit smoking in march 2013   Clinical Intake:  Pre-visit preparation completed: Yes  Pain : 0-10 Pain Score: 5  Pain Type: Chronic pain Pain Location: Knee Pain Orientation: Right,Left Pain Descriptors / Indicators: Aching Pain Onset: More than a month ago Pain Frequency: Intermittent     Nutritional Risks: None Diabetes: No  How often do you need to have someone help you when you read instructions, pamphlets, or other written materials from your doctor or pharmacy?: 1 - Never What is the last grade level you completed in school?: 12th  Diabetic: No Nutrition Risk Assessment:  Has the patient had any N/V/D within the last 2 months?  No  Does the patient have any non-healing wounds?  No  Has the patient had any unintentional weight loss or weight gain?  No   Diabetes:  Is the patient diabetic?  No  If diabetic, was a CBG obtained today?  N/A Did the patient bring in their glucometer from home?  N/A How often do you monitor your CBG's? N/A.   Financial Strains and Diabetes Management:  Are you having any financial strains with the device, your supplies or your medication? N/A.  Does the patient want to be seen by Chronic Care Management for management of their diabetes?  N/A Would the patient like to be referred to a Nutritionist or for Diabetic Management?  N/A    Interpreter Needed?: No  Information entered by :: CJohnson, LPN   Activities of Daily Living In your present state of health, do you have any difficulty performing the following  activities: 03/23/2021  Hearing? N  Vision? N  Difficulty concentrating or making decisions? N  Walking or climbing stairs? N  Dressing or bathing? N  Doing errands, shopping? N  Preparing Food and eating ? N  Using the Toilet? N  In the past six months, have you accidently leaked urine? N  Do you have problems with loss of bowel control? N  Managing your Medications? N  Managing your Finances? N  Housekeeping or managing your Housekeeping? N  Some recent data might be hidden    Patient Care Team: Jinny Sanders, MD as PCP - General  Indicate any recent Medical Services you may have received from other than [facility] providers in the past year (date may be approximate).     Assessment:   This is a routine  wellness examination for Dola.  Hearing/Vision screen  Hearing Screening   125Hz  250Hz  500Hz  1000Hz  2000Hz  3000Hz  4000Hz  6000Hz  8000Hz   Right ear:           Left ear:           Vision Screening Comments: Patient gets annual eye exams   Dietary issues and exercise activities discussed: Current Exercise Habits: Home exercise routine, Type of exercise: walking, Time (Minutes): 30, Frequency (Times/Week): 7, Weekly Exercise (Minutes/Week): 210, Intensity: Moderate, Exercise limited by: None identified  Goals    . Patient Stated     03/23/2021, I will continue to walk daily for about 20-30 minutes.      Depression Screen PHQ 2/9 Scores 03/23/2021 01/14/2020 11/27/2018 11/10/2017 10/27/2016 04/15/2014  PHQ - 2 Score 0 0 0 0 0 0  PHQ- 9 Score 0 - - - - -    Fall Risk Fall Risk  03/23/2021 01/14/2020 11/27/2018 11/10/2017 10/27/2016  Falls in the past year? 0 0 0 No No  Number falls in past yr: 0 - - - -  Injury with Fall? 0 - - - -  Risk for fall due to : Medication side effect - - - -  Follow up Falls evaluation completed;Falls prevention discussed - - - -    FALL RISK PREVENTION PERTAINING TO THE HOME:  Any stairs in or around the home? Yes  If so, are there any without  handrails? No  Home free of loose throw rugs in walkways, pet beds, electrical cords, etc? Yes  Adequate lighting in your home to reduce risk of falls? Yes   ASSISTIVE DEVICES UTILIZED TO PREVENT FALLS:  Life alert? No  Use of a cane, walker or w/c? No  Grab bars in the bathroom? No  Shower chair or bench in shower? No  Elevated toilet seat or a handicapped toilet? No   TIMED UP AND GO:  Was the test performed? N/A telephone visit .   Cognitive Function: MMSE - Mini Mental State Exam 03/23/2021  Orientation to time 5  Orientation to Place 5  Registration 3  Attention/ Calculation 5  Recall 3  Language- repeat 1       Mini Cog  Mini-Cog screen was completed. Maximum score is 22. A value of 0 denotes this part of the MMSE was not completed or the patient failed this part of the Mini-Cog screening.   Immunizations Immunization History  Administered Date(s) Administered  . Fluad Quad(high Dose 65+) 11/01/2019, 11/01/2019  . Influenza,inj,Quad PF,6+ Mos 11/10/2017, 10/25/2018  . Pneumococcal Conjugate-13 06/11/2015  . Pneumococcal Polysaccharide-23 04/15/2014  . Td 12/26/1993  . Tdap 08/30/2011  . Zoster 10/02/2015    TDAP status: Up to date  Flu Vaccine status: Declined, Education has been provided regarding the importance of this vaccine but patient still declined. Advised may receive this vaccine at local pharmacy or Health Dept. Aware to provide a copy of the vaccination record if obtained from local pharmacy or Health Dept. Verbalized acceptance and understanding.  Pneumococcal vaccine status: Up to date  Covid-19 vaccine status: Declined, Education has been provided regarding the importance of this vaccine but patient still declined. Advised may receive this vaccine at local pharmacy or Health Dept.or vaccine clinic. Aware to provide a copy of the vaccination record if obtained from local pharmacy or Health Dept. Verbalized acceptance and understanding.  Qualifies  for Shingles Vaccine? Yes   Zostavax completed Yes   Shingrix Completed?: No.    Education has been provided regarding  the importance of this vaccine. Patient has been advised to call insurance company to determine out of pocket expense if they have not yet received this vaccine. Advised may also receive vaccine at local pharmacy or Health Dept. Verbalized acceptance and understanding.  Screening Tests Health Maintenance  Topic Date Due  . MAMMOGRAM  01/17/2021  . INFLUENZA VACCINE  05/09/2021 (Originally 07/26/2020)  . COVID-19 Vaccine (1) 04/08/2022 (Originally 01/07/1956)  . TETANUS/TDAP  08/29/2021  . COLONOSCOPY (Pts 45-89yrs Insurance coverage will need to be confirmed)  11/13/2022  . DEXA SCAN  Completed  . Hepatitis C Screening  Completed  . PNA vac Low Risk Adult  Completed  . HPV VACCINES  Aged Out    Health Maintenance  Health Maintenance Due  Topic Date Due  . MAMMOGRAM  01/17/2021    Colorectal cancer screening: Type of screening: Colonoscopy. Completed 11/13/2012. Repeat every 10 years  Mammogram status: due, Patient will call and schedule appointment   Bone Density status: due, Patient will call and schedule appointment   Lung Cancer Screening: (Low Dose CT Chest recommended if Age 48-80 years, 30 pack-year currently smoking OR have quit w/in 15 years.) does not qualify.    Additional Screening:  Hepatitis C Screening: does qualify; Completed 10/27/2016  Vision Screening: Recommended annual ophthalmology exams for early detection of glaucoma and other disorders of the eye. Is the patient up to date with their annual eye exam?  Yes  Who is the provider or what is the name of the office in which the patient attends annual eye exams? Eye doctor in [place] If pt is not established with a provider, would they like to be referred to a provider to establish care? No .   Dental Screening: Recommended annual dental exams for proper oral hygiene  Community Resource  Referral / Chronic Care Management: CRR required this visit?  No   CCM required this visit?  No      Plan:     I have personally reviewed and noted the following in the patient's chart:   . Medical and social history . Use of alcohol, tobacco or illicit drugs  . Current medications and supplements . Functional ability and status . Nutritional status . Physical activity . Advanced directives . List of other physicians . Hospitalizations, surgeries, and ER visits in previous 12 months . Vitals . Screenings to include cognitive, depression, and falls . Referrals and appointments  In addition, I have reviewed and discussed with patient certain preventive protocols, quality metrics, and best practice recommendations. A written personalized care plan for preventive services as well as general preventive health recommendations were provided to patient.   Due to this being a telephonic visit, the after visit summary with patients personalized plan was offered to patient via office or my-chart. Patient preferred to pick up at office at next visit or via mychart.   Andrez Grime, LPN   [id number]

## 2021-03-23 NOTE — Telephone Encounter (Signed)
Forwarded to [place] to schedule CPE with fasting labs.

## 2021-03-23 NOTE — Telephone Encounter (Signed)
Completed AWV. Patient would like someone to call her and schedule her physical and lab work.

## 2021-03-23 NOTE — Telephone Encounter (Signed)
Spoke with Patient scheduled CPE with Labs

## 2021-03-23 NOTE — Progress Notes (Signed)
PCP notes:  Health Maintenance: Flu- declined Covid- declined Mammogram- due Dexa- due   Abnormal Screenings: none   Patient concerns: none   Nurse concerns: none   Next PCP appt.: none

## 2021-04-15 ENCOUNTER — Telehealth: Payer: Self-pay | Admitting: Family Medicine

## 2021-04-15 DIAGNOSIS — R7303 Prediabetes: Secondary | ICD-10-CM

## 2021-04-15 DIAGNOSIS — E7800 Pure hypercholesterolemia, unspecified: Secondary | ICD-10-CM

## 2021-04-15 NOTE — Telephone Encounter (Signed)
-----   Message from Ellamae Sia sent at 04/05/2021  2:11 PM EDT ----- Regarding: Lab orders for Monday, 4.25.22 Patient is scheduled for CPX labs, please order future labs, Thanks , Karna Christmas

## 2021-04-19 ENCOUNTER — Other Ambulatory Visit (INDEPENDENT_AMBULATORY_CARE_PROVIDER_SITE_OTHER): Payer: Medicare Other

## 2021-04-19 ENCOUNTER — Other Ambulatory Visit: Payer: Self-pay

## 2021-04-19 DIAGNOSIS — E7800 Pure hypercholesterolemia, unspecified: Secondary | ICD-10-CM

## 2021-04-19 DIAGNOSIS — R7303 Prediabetes: Secondary | ICD-10-CM | POA: Diagnosis not present

## 2021-04-19 LAB — LIPID PANEL
Cholesterol: 176 mg/dL (ref 0–200)
HDL: 35.2 mg/dL — ABNORMAL LOW (ref >39.00)
LDL Cholesterol: 101 mg/dL — ABNORMAL HIGH (ref 0–99)
NonHDL: 140.42
Total CHOL/HDL Ratio: 5
Triglycerides: 197 mg/dL — ABNORMAL HIGH (ref 0.0–149.0)
VLDL: 39.4 mg/dL (ref 0.0–40.0)

## 2021-04-19 LAB — COMPREHENSIVE METABOLIC PANEL
ALT: 9 U/L (ref 0–35)
AST: 19 U/L (ref 0–37)
Albumin: 4.1 g/dL (ref 3.5–5.2)
Alkaline Phosphatase: 66 U/L (ref 39–117)
BUN: 20 mg/dL (ref 6–23)
CO2: 31 mEq/L (ref 19–32)
Calcium: 10.1 mg/dL (ref 8.4–10.5)
Chloride: 98 mEq/L (ref 96–112)
Creatinine, Ser: 1.01 mg/dL (ref 0.40–1.20)
GFR: 55.71 mL/min — ABNORMAL LOW (ref >60.00)
Glucose, Bld: 105 mg/dL — ABNORMAL HIGH (ref 70–99)
Potassium: 3.5 mEq/L (ref 3.5–5.1)
Sodium: 136 mEq/L (ref 135–145)
Total Bilirubin: 0.7 mg/dL (ref 0.2–1.2)
Total Protein: 7.2 g/dL (ref 6.0–8.3)

## 2021-04-19 LAB — HEMOGLOBIN A1C: Hgb A1c MFr Bld: 5.8 % (ref 4.6–6.5)

## 2021-04-19 NOTE — Progress Notes (Signed)
No critical labs need to be addressed urgently. We will discuss labs in detail at upcoming office visit.   

## 2021-04-26 ENCOUNTER — Other Ambulatory Visit: Payer: Self-pay

## 2021-04-26 ENCOUNTER — Encounter: Payer: Self-pay | Admitting: Family Medicine

## 2021-04-26 ENCOUNTER — Ambulatory Visit (INDEPENDENT_AMBULATORY_CARE_PROVIDER_SITE_OTHER): Payer: Medicare Other | Admitting: Family Medicine

## 2021-04-26 VITALS — BP 130/80 | HR 63 | Temp 97.7°F | Ht 63.25 in | Wt 192.2 lb

## 2021-04-26 DIAGNOSIS — Z0000 Encounter for general adult medical examination without abnormal findings: Secondary | ICD-10-CM

## 2021-04-26 DIAGNOSIS — R7303 Prediabetes: Secondary | ICD-10-CM | POA: Diagnosis not present

## 2021-04-26 DIAGNOSIS — M170 Bilateral primary osteoarthritis of knee: Secondary | ICD-10-CM

## 2021-04-26 DIAGNOSIS — E66811 Obesity, class 1: Secondary | ICD-10-CM

## 2021-04-26 DIAGNOSIS — I10 Essential (primary) hypertension: Secondary | ICD-10-CM

## 2021-04-26 DIAGNOSIS — E7800 Pure hypercholesterolemia, unspecified: Secondary | ICD-10-CM

## 2021-04-26 DIAGNOSIS — E669 Obesity, unspecified: Secondary | ICD-10-CM

## 2021-04-26 NOTE — Assessment & Plan Note (Signed)
Encouraged exercise, weight loss, healthy eating habits. ? ?

## 2021-04-26 NOTE — Patient Instructions (Signed)
Please call the location of your choice from the menu below to schedule your Mammogram and/or Bone Density appointment.   ° °[place]  ° °1. [facility]                °      Phone:  [phone] °[address]                               °[place], [place] [address]                                                             °Services: Traditional and 3D Mammogram, Bone Density  ° °2. [facility]           °      Phone: [phone] °[address]                                                       °[place], [place] [address]    °Service: Bone Density ONLY  ° *this site does NOT perform mammograms ° °3. [facility]                       ° Phone:  [phone] °[address]                                  °[place], [place] [address]                                            °Services:  3D Mammogram and Bone Density  ° ° °[place] ° °1. [facility]   °Phone:  [phone]   °[address]                                                                            °[place], [place] [address]                                            °Services: 3D Mammogram and Bone Density ° °2. [facility] [place] ([facility])  °Phone:  [phone]   °[address]                        °[place], [place] [address]                                              °  Services:  3D Mammogram and Bone Density

## 2021-04-26 NOTE — Progress Notes (Signed)
Patient ID: Jade Mcguire, female    DOB: 1951/04/09, 70 y.o.   MRN: [id number]  This visit was conducted in person.  BP 130/80   Pulse 63   Temp 97.7 F (36.5 C) (Temporal)   Ht 5' 3.25" (1.607 m)   Wt 192 lb 4 oz (87.2 kg)   SpO2 96%   BMI 33.79 kg/m    CC: Chief Complaint  Patient presents with  . Annual Exam    Part 2    Subjective:   HPI: Jade Mcguire is a 70 y.o. female presenting on 04/26/2021 for Annual Exam (Part 2)  The patient presents for complete physical and review of chronic health problems. He/She also has the following acute concerns today: none   The patient saw a LPN or RN for medicare wellness visit.  Prevention and wellness was reviewed in detail. Note reviewed and important notes copied below.  Hypertension:  At goal on atenolol chlorthalidone 100/25 mg daily  BP Readings from Last 3 Encounters:  04/26/21 130/80  01/14/20 130/80  11/27/18 120/80  Using medication without problems or lightheadedness: none  Chest pain with exertion:none Edema: none Short of breath: none Average home BPs: Other issues:  Elevated Cholesterol:  LDL almost  at goal  NOT using atorvastatin 10 mg daily.. had joint pain.  On red yeast rice.Marland Kitchen still 16 % 10 year risk.. but does not want to use statin now. Lab Results  Component Value Date   CHOL 176 04/19/2021   HDL 35.20 (L) 04/19/2021   LDLCALC 101 (H) 04/19/2021   LDLDIRECT 127.0 01/07/2020   TRIG 197.0 (H) 04/19/2021   CHOLHDL 5 04/19/2021  Using medications without problems: Muscle aches:  Diet compliance:moderate Exercise: walking 20-25 min each day. Other complaints:    prediabetes: stable control Lab Results  Component Value Date   HGBA1C 5.8 04/19/2021     Body mass index is 33.79 kg/m.  Wt Readings from Last 3 Encounters:  04/26/21 192 lb 4 oz (87.2 kg)  01/14/20 194 lb 8 oz (88.2 kg)  11/27/18 194 lb 8 oz (88.2 kg)         Relevant past medical, surgical, family and social  history reviewed and updated as indicated. Interim medical history since our last visit reviewed. Allergies and medications reviewed and updated. Outpatient Medications Prior to Visit  Medication Sig Dispense Refill  . Ascorbic Acid (VITAMIN C PO) Take 1 tablet by mouth daily.    Marland Kitchen atenolol-chlorthalidone (TENORETIC) 100-25 MG tablet TAKE 1 TABLET BY MOUTH EVERY DAY 90 tablet 3  . cholecalciferol (VITAMIN D) 400 UNITS TABS tablet Take 400 Units by mouth 2 (two) times daily.    . Glucosamine Sulfate 1000 MG TABS Take 1 tablet by mouth daily.    . Multiple Vitamin (MULTIVITAMIN) tablet Take 1 tablet by mouth daily.    . Omega-3 Fatty Acids (FISH OIL) 1000 MG CAPS Take 1 capsule by mouth daily.    . Red Yeast Rice 600 MG CAPS Take 1,200 mg by mouth 2 (two) times daily.    Marland Kitchen atorvastatin (LIPITOR) 10 MG tablet TAKE 1 TABLET BY MOUTH EVERY DAY (Patient not taking: No sig reported) 90 tablet 0   No facility-administered medications prior to visit.     Per HPI unless specifically indicated in ROS section below Review of Systems  Constitutional: Negative for fatigue and fever.  HENT: Negative for congestion.   Eyes: Negative for pain.  Respiratory: Negative for cough and shortness of breath.  Cardiovascular: Negative for chest pain, palpitations and leg swelling.  Gastrointestinal: Negative for abdominal pain.  Genitourinary: Negative for dysuria and vaginal bleeding.  Musculoskeletal: Negative for back pain.  Neurological: Negative for syncope, light-headedness and headaches.  Psychiatric/Behavioral: Negative for dysphoric mood.   Objective:  BP 130/80   Pulse 63   Temp 97.7 F (36.5 C) (Temporal)   Ht 5' 3.25" (1.607 m)   Wt 192 lb 4 oz (87.2 kg)   SpO2 96%   BMI 33.79 kg/m   Wt Readings from Last 3 Encounters:  04/26/21 192 lb 4 oz (87.2 kg)  01/14/20 194 lb 8 oz (88.2 kg)  11/27/18 194 lb 8 oz (88.2 kg)      Physical Exam Constitutional:      General: She is not in acute  distress.    Appearance: Normal appearance. She is well-developed. She is obese. She is not ill-appearing or toxic-appearing.  HENT:     Head: Normocephalic.     Right Ear: Hearing, tympanic membrane, ear canal and external ear normal.     Left Ear: Hearing, tympanic membrane, ear canal and external ear normal.     Nose: Nose normal.  Eyes:     General: Lids are normal. Lids are everted, no foreign bodies appreciated.     Conjunctiva/sclera: Conjunctivae normal.     Pupils: Pupils are equal, round, and reactive to light.  Neck:     Thyroid: No thyroid mass or thyromegaly.     Vascular: No carotid bruit.     Trachea: Trachea normal.  Cardiovascular:     Rate and Rhythm: Normal rate and regular rhythm.     Heart sounds: Normal heart sounds, S1 normal and S2 normal. No murmur heard. No gallop.   Pulmonary:     Effort: Pulmonary effort is normal. No respiratory distress.     Breath sounds: Normal breath sounds. No wheezing, rhonchi or rales.  Abdominal:     General: Bowel sounds are normal. There is no distension or abdominal bruit.     Palpations: Abdomen is soft. There is no fluid wave or mass.     Tenderness: There is no abdominal tenderness. There is no guarding or rebound.     Hernia: No hernia is present.  Musculoskeletal:     Cervical back: Normal range of motion and neck supple.  Lymphadenopathy:     Cervical: No cervical adenopathy.  Skin:    General: Skin is warm and dry.     Findings: No rash.  Neurological:     Mental Status: She is alert.     Cranial Nerves: No cranial nerve deficit.     Sensory: No sensory deficit.  Psychiatric:        Mood and Affect: Mood is not anxious or depressed.        Speech: Speech normal.        Behavior: Behavior normal. Behavior is cooperative.        Judgment: Judgment normal.       Results for orders placed or performed in visit on 04/19/21  Comprehensive metabolic panel  Result Value Ref Range   Sodium 136 135 - 145 mEq/L    Potassium 3.5 3.5 - 5.1 mEq/L   Chloride 98 96 - 112 mEq/L   CO2 31 19 - 32 mEq/L   Glucose, Bld 105 (H) 70 - 99 mg/dL   BUN 20 6 - 23 mg/dL   Creatinine, Ser 1.01 0.40 - 1.20 mg/dL   Total Bilirubin 0.7 0.2 -  1.2 mg/dL   Alkaline Phosphatase 66 39 - 117 U/L   AST 19 0 - 37 U/L   ALT 9 0 - 35 U/L   Total Protein 7.2 6.0 - 8.3 g/dL   Albumin 4.1 3.5 - 5.2 g/dL   GFR 55.71 (L) >60.00 mL/min   Calcium 10.1 8.4 - 10.5 mg/dL  Lipid panel  Result Value Ref Range   Cholesterol 176 0 - 200 mg/dL   Triglycerides 197.0 (H) 0.0 - 149.0 mg/dL   HDL 35.20 (L) >39.00 mg/dL   VLDL 39.4 0.0 - 40.0 mg/dL   LDL Cholesterol 101 (H) 0 - 99 mg/dL   Total CHOL/HDL Ratio 5    NonHDL 140.42   Hemoglobin A1c  Result Value Ref Range   Hgb A1c MFr Bld 5.8 4.6 - 6.5 %    This visit occurred during the SARS-CoV-2 public health emergency.  Safety protocols were in place, including screening questions prior to the visit, additional usage of staff PPE, and extensive cleaning of exam room while observing appropriate contact time as indicated for disinfecting solutions.   COVID 19 screen:  No recent travel or known exposure to COVID19 The patient denies respiratory symptoms of COVID 19 at this time. The importance of social distancing was discussed today.   Assessment and Plan The patient's preventative maintenance and recommended screening tests for an annual wellness exam were reviewed in full today. Brought up to date unless services declined.  Counselled on the importance of diet, exercise, and its role in overall health and mortality. The patient's FH and SH was reviewed, including their home life, tobacco status, and drug and alcohol status.   Vaccines:Uptodate flu.  Discussed COVID19 vaccine side effects and benefits. Strongly encouraged the patient to get the vaccine. Questions answered.  Shingrix discussed.. not interested. Mammo:12/2018 stable, plan q 2 years... DUE DEXA 12/2018 stable  osteopenia, mother with osteoporosis Vit D nml. Repeat in 5 years. Pap/DVE: partial hysterectomy for fibroids, No fam hx ovarian Ca. No DVE indicated. Colon: 10/2012 Dr. Fuller Plan, repeat in 10 years Former smoker, 33pack year hx, [phone]. No daily cough, no SOB. Last spiro nml 2012 She refuses lung cancer CT screening again this year.. refused again 2022 Hep C:done  Problem List Items Addressed This Visit    Bilateral primary osteoarthritis of knee   Essential hypertension    Stable, chronic.  Continue current medication.  At goal on atenolol chlorthalidone 100/25 mg daily          Hyperlipidemia    Stable, chronic.  Continue current medication.   Red yeast rice 600 mg BID 2 tabs BID  Had SE to atorvastatin.      Obesity (BMI 30.0-34.9)    Encouraged exercise, weight loss, healthy eating habits.       Prediabetes    Stable control.       Other Visit Diagnoses    Routine general medical examination at a health care facility    -  Primary      Jade Lofts, MD

## 2021-04-26 NOTE — Assessment & Plan Note (Signed)
Stable, chronic.  Continue current medication.  At goal on atenolol chlorthalidone 100/25 mg daily

## 2021-04-26 NOTE — Assessment & Plan Note (Addendum)
Stable, chronic.  Continue current medication.   Red yeast rice 600 mg BID 2 tabs BID  Had SE to atorvastatin.

## 2021-04-26 NOTE — Assessment & Plan Note (Signed)
Stable control. 

## 2022-01-16 ENCOUNTER — Other Ambulatory Visit: Payer: Self-pay | Admitting: Family Medicine

## 2022-03-24 ENCOUNTER — Ambulatory Visit: Payer: Medicare Other

## 2022-04-16 ENCOUNTER — Other Ambulatory Visit: Payer: Self-pay | Admitting: Family Medicine

## 2022-04-26 ENCOUNTER — Ambulatory Visit (INDEPENDENT_AMBULATORY_CARE_PROVIDER_SITE_OTHER): Payer: Medicare Other

## 2022-04-26 ENCOUNTER — Telehealth: Payer: Self-pay

## 2022-04-26 VITALS — Ht 63.0 in | Wt 192.0 lb

## 2022-04-26 DIAGNOSIS — Z0000 Encounter for general adult medical examination without abnormal findings: Secondary | ICD-10-CM

## 2022-04-26 MED ORDER — ATENOLOL-CHLORTHALIDONE 100-25 MG PO TABS: 1.0000 | ORAL_TABLET | Freq: Every day | ORAL | 0 refills | Status: DC

## 2022-04-26 NOTE — Telephone Encounter (Signed)
Patient has CPE scheduled for July.  Refill sent as requested to get her to that appointment.  ?

## 2022-04-26 NOTE — Telephone Encounter (Signed)
Patient called requesting refill on Atenolol. She has not been seen in over a year.  ?

## 2022-04-26 NOTE — Addendum Note (Signed)
Addended by: Carter Kitten on: 04/26/2022 11:07 AM ? ? Modules accepted: Orders ? ?

## 2022-04-26 NOTE — Patient Instructions (Signed)
Ms. Blando , ?Thank you for taking time to come for your Medicare Wellness Visit. I appreciate your ongoing commitment to your health goals. Please review the following plan we discussed and let me know if I can assist you in the future.  ? ?Screening recommendations/referrals: ?Colonoscopy: Done 11/13/2012 - Repeat in 10 years *declines  ?Mammogram: Done 2020 - declines repeats ?Bone Density: Done 01/17/2019 - Repeat in 5 years ?Recommended yearly ophthalmology/optometry visit for glaucoma screening and checkup ?Recommended yearly dental visit for hygiene and checkup ? ?Vaccinations: ?Influenza vaccine: Declined - recommended every fall ?Pneumococcal vaccine: Done 04/15/2014 & 06/11/2015 ?Tdap vaccine: Done 08/30/2011 - Repeat in 10 years *due ?Shingles vaccine: Zostavax done 2016 - due for Shingrix - Shingrix is 2 doses 2-6 months apart and over 90% effective     ?Covid-19: Declined ? ?Advanced directives: Advance directive discussed with you today. Even though you declined this today, please call our office should you change your mind, and we can give you the proper paperwork for you to fill out.  ? ?Conditions/risks identified: Aim for 30 minutes of exercise or brisk walking, 6-8 glasses of water, and 5 servings of fruits and vegetables each day.  ? ?Next appointment: Follow up in one year for your annual wellness visit  ? ? ?Preventive Care 73 Years and Older, Female ?Preventive care refers to lifestyle choices and visits with your health care provider that can promote health and wellness. ?What does preventive care include? ?A yearly physical exam. This is also called an annual well check. ?Dental exams once or twice a year. ?Routine eye exams. Ask your health care provider how often you should have your eyes checked. ?Personal lifestyle choices, including: ?Daily care of your teeth and gums. ?Regular physical activity. ?Eating a healthy diet. ?Avoiding tobacco and drug use. ?Limiting alcohol use. ?Practicing safe  sex. ?Taking low-dose aspirin every day. ?Taking vitamin and mineral supplements as recommended by your health care provider. ?What happens during an annual well check? ?The services and screenings done by your health care provider during your annual well check will depend on your age, overall health, lifestyle risk factors, and family history of disease. ?Counseling  ?Your health care provider may ask you questions about your: ?Alcohol use. ?Tobacco use. ?Drug use. ?Emotional well-being. ?Home and relationship well-being. ?Sexual activity. ?Eating habits. ?History of falls. ?Memory and ability to understand (cognition). ?Work and work Statistician. ?Reproductive health. ?Screening  ?You may have the following tests or measurements: ?Height, weight, and BMI. ?Blood pressure. ?Lipid and cholesterol levels. These may be checked every 5 years, or more frequently if you are over 73 years old. ?Skin check. ?Lung cancer screening. You may have this screening every year starting at age 73 if you have a 30-pack-year history of smoking and currently smoke or have quit within the past 15 years. ?Fecal occult blood test (FOBT) of the stool. You may have this test every year starting at age 73. ?Flexible sigmoidoscopy or colonoscopy. You may have a sigmoidoscopy every 5 years or a colonoscopy every 10 years starting at age 73. ?Hepatitis C blood test. ?Hepatitis B blood test. ?Sexually transmitted disease (STD) testing. ?Diabetes screening. This is done by checking your blood sugar (glucose) after you have not eaten for a while (fasting). You may have this done every 1-3 years. ?Bone density scan. This is done to screen for osteoporosis. You may have this done starting at age 73. ?Mammogram. This may be done every 1-2 years. Talk to your health care  provider about how often you should have regular mammograms. ?Talk with your health care provider about your test results, treatment options, and if necessary, the need for more  tests. ?Vaccines  ?Your health care provider may recommend certain vaccines, such as: ?Influenza vaccine. This is recommended every year. ?Tetanus, diphtheria, and acellular pertussis (Tdap, Td) vaccine. You may need a Td booster every 10 years. ?Zoster vaccine. You may need this after age 73. ?Pneumococcal 13-valent conjugate (PCV13) vaccine. One dose is recommended after age 73. ?Pneumococcal polysaccharide (PPSV23) vaccine. One dose is recommended after age 73. ?Talk to your health care provider about which screenings and vaccines you need and how often you need them. ?This information is not intended to replace advice given to you by your health care provider. Make sure you discuss any questions you have with your health care provider. ?Document Released: 01/08/2016 Document Revised: 08/31/2016 Document Reviewed: 10/13/2015 ?Elsevier Interactive Patient Education ? 2017 [employer]. ? ?Fall Prevention in the Home ?Falls can cause injuries. They can happen to people of all ages. There are many things you can do to make your home safe and to help prevent falls. ?What can I do on the outside of my home? ?Regularly fix the edges of walkways and driveways and fix any cracks. ?Remove anything that might make you trip as you walk through a door, such as a raised step or threshold. ?Trim any bushes or trees on the path to your home. ?Use bright outdoor lighting. ?Clear any walking paths of anything that might make someone trip, such as rocks or tools. ?Regularly check to see if handrails are loose or broken. Make sure that both sides of any steps have handrails. ?Any raised decks and porches should have guardrails on the edges. ?Have any leaves, snow, or ice cleared regularly. ?Use sand or salt on walking paths during winter. ?Clean up any spills in your garage right away. This includes oil or grease spills. ?What can I do in the bathroom? ?Use night lights. ?Install grab bars by the toilet and in the tub and shower.  Do not use towel bars as grab bars. ?Use non-skid mats or decals in the tub or shower. ?If you need to sit down in the shower, use a plastic, non-slip stool. ?Keep the floor dry. Clean up any water that spills on the floor as soon as it happens. ?Remove soap buildup in the tub or shower regularly. ?Attach bath mats securely with double-sided non-slip rug tape. ?Do not have throw rugs and other things on the floor that can make you trip. ?What can I do in the bedroom? ?Use night lights. ?Make sure that you have a light by your bed that is easy to reach. ?Do not use any sheets or blankets that are too big for your bed. They should not hang down onto the floor. ?Have a firm chair that has side arms. You can use this for support while you get dressed. ?Do not have throw rugs and other things on the floor that can make you trip. ?What can I do in the kitchen? ?Clean up any spills right away. ?Avoid walking on wet floors. ?Keep items that you use a lot in easy-to-reach places. ?If you need to reach something above you, use a strong step stool that has a grab bar. ?Keep electrical cords out of the way. ?Do not use floor polish or wax that makes floors slippery. If you must use wax, use non-skid floor wax. ?Do not have throw rugs  and other things on the floor that can make you trip. ?What can I do with my stairs? ?Do not leave any items on the stairs. ?Make sure that there are handrails on both sides of the stairs and use them. Fix handrails that are broken or loose. Make sure that handrails are as long as the stairways. ?Check any carpeting to make sure that it is firmly attached to the stairs. Fix any carpet that is loose or worn. ?Avoid having throw rugs at the top or bottom of the stairs. If you do have throw rugs, attach them to the floor with carpet tape. ?Make sure that you have a light switch at the top of the stairs and the bottom of the stairs. If you do not have them, ask someone to add them for you. ?What else  can I do to help prevent falls? ?Wear shoes that: ?Do not have high heels. ?Have rubber bottoms. ?Are comfortable and fit you well. ?Are closed at the toe. Do not wear sandals. ?If you use a stepladder: ?Dillard Essex

## 2022-04-26 NOTE — Progress Notes (Signed)
? ?Subjective:  ? Jade Mcguire is a 71 y.o. female who presents for Medicare Annual (Subsequent) preventive examination. ? ?Virtual Visit via Telephone Note ? ?I connected with  Jade Mcguire on 04/26/22 at 12:00 PM EDT by telephone and verified that I am speaking with the correct person using two identifiers. ? ?Location: ?Patient: Home ?Provider: [facility] ?Persons participating in the virtual visit: patient/Nurse Health Advisor ?  ?I discussed the limitations, risks, security and privacy concerns of performing an evaluation and management service by telephone and the availability of in person appointments. The patient expressed understanding and agreed to proceed. ? ?Interactive audio and video telecommunications were attempted between this nurse and patient, however failed, due to patient having technical difficulties OR patient did not have access to video capability.  We continued and completed visit with audio only. ? ?Some vital signs may be absent or patient reported.  ? ?Jade Dimaio Dionne Ano, LPN  ? ?Review of Systems    ? ?Cardiac Risk Factors include: advanced age (>70mn, >>35women);dyslipidemia;hypertension;sedentary lifestyle;obesity (BMI >30kg/m2) ? ?   ?Objective:  ?  ?Today's Vitals  ? 04/26/22 1202 04/26/22 1203  ?Weight: 192 lb (87.1 kg)   ?Height: '5\' 3"'$  (1.6 m)   ?PainSc:  5   ? ?Body mass index is 34.01 kg/m?. ? ? ?  04/26/2022  ? 12:08 PM 03/23/2021  ? 12:07 PM 10/27/2016  ?  3:51 PM 10/27/2016  ?  3:50 PM  ?Advanced Directives  ?Does Patient Have a Medical Advance Directive? No No No No  ?Would patient like information on creating a medical advance directive? No - Patient declined No - Patient declined Yes - Educational materials given   ? ? ?Current Medications (verified) ?Outpatient Encounter Medications as of 04/26/2022  ?Medication Sig  ? Ascorbic Acid (VITAMIN C PO) Take 1 tablet by mouth daily.  ? atenolol-chlorthalidone (TENORETIC) 100-25 MG tablet Take 1 tablet by mouth daily.  ?  cholecalciferol (VITAMIN D) 400 UNITS TABS tablet Take 400 Units by mouth 2 (two) times daily.  ? Glucosamine Sulfate 1000 MG TABS Take 1 tablet by mouth daily.  ? Multiple Vitamin (MULTIVITAMIN) tablet Take 1 tablet by mouth daily.  ? Omega-3 Fatty Acids (FISH OIL) 1000 MG CAPS Take 1 capsule by mouth daily.  ? Red Yeast Rice 600 MG CAPS Take 1,200 mg by mouth 2 (two) times daily.  ? [DISCONTINUED] atenolol-chlorthalidone (TENORETIC) 100-25 MG tablet TAKE 1 TABLET BY MOUTH EVERY DAY  ? ?No facility-administered encounter medications on file as of 04/26/2022.  ? ? ?Allergies (verified) ?Patient has no known allergies.  ? ?History: ?Past Medical History:  ?Diagnosis Date  ? Palpitations   ? pt denies any at this time.  ? Tobacco use disorder   ? Unspecified essential hypertension   ? Unspecified viral infection, in conditions classified elsewhere and of unspecified site   ? ?Past Surgical History:  ?Procedure Laterality Date  ? ABDOMINAL HYSTERECTOMY    ? CHOLECYSTECTOMY    ? COLONOSCOPY    ? last 2003  ? POLYPECTOMY    ? TUBAL LIGATION    ? VEIN LIGATION AND STRIPPING    ? ?Family History  ?Problem Relation Age of Onset  ? Hypertension Mother   ? Osteoporosis Mother   ? Heart attack Father   ? Emphysema Father   ? Diabetes Son   ? Cancer Maternal Aunt   ? Breast cancer Sister 551 ? Colon cancer Neg Hx   ? ?Social History  ? ?  Socioeconomic History  ? Marital status: Married  ?  Spouse name: Not on file  ? Number of children: 2  ? Years of education: Not on file  ? Highest education level: Not on file  ?Occupational History  ? Occupation: AGENT  ?  Employer: [employer]  ?Tobacco Use  ? Smoking status: Former  ?  Types: Cigarettes  ? Smokeless tobacco: Never  ? Tobacco comments:  ?  using electronic cigarettes-quit smoking in march 2013 - smoked for about 30 years  ?Substance and Sexual Activity  ? Alcohol use: No  ? Drug use: No  ? Sexual activity: Not on file  ?Other Topics Concern  ? Not on file  ?Social History  Narrative  ? Reviewed 2015   ? Full code, no living will, no HCPOA.  ? ?Social Determinants of Health  ? ?Financial Resource Strain: Low Risk   ? Difficulty of Paying Living Expenses: Not hard at all  ?Food Insecurity: No Food Insecurity  ? Worried About Charity fundraiser in the Last Year: Never true  ? Ran Out of Food in the Last Year: Never true  ?Transportation Needs: No Transportation Needs  ? Lack of Transportation (Medical): No  ? Lack of Transportation (Non-Medical): No  ?Physical Activity: Insufficiently Active  ? Days of Exercise per Week: 7 days  ? Minutes of Exercise per Session: 20 min  ?Stress: No Stress Concern Present  ? Feeling of Stress : Not at all  ?Social Connections: Socially Integrated  ? Frequency of Communication with Friends and Family: More than three times a week  ? Frequency of Social Gatherings with Friends and Family: Twice a week  ? Attends Religious Services: More than 4 times per year  ? Active Member of Clubs or Organizations: Yes  ? Attends Archivist Meetings: More than 4 times per year  ? Marital Status: Married  ? ? ?Tobacco Counseling ?Counseling given: Not Answered ?Tobacco comments: using electronic cigarettes-quit smoking in march 2013 - smoked for about 30 years ? ? ?Clinical Intake: ? ?Pre-visit preparation completed: Yes ? ?Pain : 0-10 ?Pain Score: 5  ?Pain Type: Chronic pain ?Pain Location: Knee ?Pain Orientation: Left ?Pain Descriptors / Indicators: Aching ?Pain Onset: More than a month ago ?Pain Frequency: Intermittent ? ?  ? ?BMI - recorded: 34.01 ?Nutritional Status: BMI > 30  Obese ?Nutritional Risks: None ?Diabetes: No ? ?How often do you need to have someone help you when you read instructions, pamphlets, or other written materials from your doctor or pharmacy?: 1 - Never ? ?Diabetic? no ? ?Interpreter Needed?: No ? ?Information entered by :: Jade Manera, LPN ? ? ?Activities of Daily Living ? ?  04/26/2022  ? 12:08 PM  ?In your present state of health,  do you have any difficulty performing the following activities:  ?Hearing? 0  ?Vision? 0  ?Difficulty concentrating or making decisions? 0  ?Walking or climbing stairs? 0  ?Dressing or bathing? 0  ?Doing errands, shopping? 0  ?Preparing Food and eating ? N  ?Using the Toilet? N  ?In the past six months, have you accidently leaked urine? N  ?Do you have problems with loss of bowel control? N  ?Managing your Medications? N  ?Managing your Finances? N  ?Housekeeping or managing your Housekeeping? N  ? ? ?Patient Care Team: ?Jinny Sanders, MD as PCP - General ? ?Indicate any recent Medical Services you may have received from other than [facility] providers in the past year (date may  be approximate). ? ?   ?Assessment:  ? This is a routine wellness examination for Maralee. ? ?Hearing/Vision screen ?Hearing Screening - Comments:: Denies hearing difficulties   ?Vision Screening - Comments:: Wears rx glasses for reading prn only - up to date with routine eye exams with optometrist in [place]  ? ?Dietary issues and exercise activities discussed: ?Current Exercise Habits: Home exercise routine, Type of exercise: walking, Time (Minutes): 20, Frequency (Times/Week): 7, Weekly Exercise (Minutes/Week): 140, Intensity: Mild, Exercise limited by: None identified ? ? Goals Addressed   ? ?  ?  ?  ?  ? This Visit's Progress  ?  Patient Stated   On track  ?  04/26/2022 I will continue to walk daily for about 20-30 minutes. ?  ? ?  ? ?Depression Screen ? ?  04/26/2022  ? 12:07 PM 03/23/2021  ? 12:09 PM 01/14/2020  ? 11:31 AM 11/27/2018  ? 11:51 AM 11/10/2017  ?  8:40 AM 10/27/2016  ?  3:02 PM 04/15/2014  ? 10:35 AM  ?PHQ 2/9 Scores  ?PHQ - 2 Score 0 0 0 0 0 0 0  ?PHQ- 9 Score  0       ?  ?Fall Risk ? ?  04/26/2022  ? 12:05 PM 03/23/2021  ? 12:09 PM 01/14/2020  ? 11:30 AM 11/27/2018  ? 11:51 AM 11/10/2017  ?  8:40 AM  ?Fall Risk   ?Falls in the past year? 0 0 0 0 No  ?Number falls in past yr: 0 0     ?Injury with Fall? 0 0     ?Risk for fall due to : No  Fall Risks Medication side effect     ?Follow up Falls prevention discussed Falls evaluation completed;Falls prevention discussed     ? ? ?FALL RISK PREVENTION PERTAINING TO THE HOME: ? ?Any stairs in or a

## 2022-07-07 ENCOUNTER — Other Ambulatory Visit: Payer: Medicare Other

## 2022-07-14 ENCOUNTER — Encounter: Payer: Medicare Other | Admitting: Family Medicine

## 2022-07-22 ENCOUNTER — Other Ambulatory Visit: Payer: Self-pay | Admitting: Family Medicine

## 2022-10-23 ENCOUNTER — Other Ambulatory Visit: Payer: Self-pay | Admitting: Family Medicine

## 2022-11-12 ENCOUNTER — Telehealth: Payer: Self-pay | Admitting: Family Medicine

## 2022-11-12 DIAGNOSIS — E7800 Pure hypercholesterolemia, unspecified: Secondary | ICD-10-CM

## 2022-11-12 DIAGNOSIS — R7303 Prediabetes: Secondary | ICD-10-CM

## 2022-11-12 NOTE — Telephone Encounter (Signed)
-----   Message from Velna Hatchet, RT sent at 11/09/2022 11:47 AM EST ----- Regarding: Fri 12/1 lab Patient is scheduled for cpx, please order future labs.  Thanks, Anda Kraft

## 2022-11-16 ENCOUNTER — Encounter: Payer: Self-pay | Admitting: Gastroenterology

## 2022-11-25 ENCOUNTER — Other Ambulatory Visit (INDEPENDENT_AMBULATORY_CARE_PROVIDER_SITE_OTHER): Payer: Medicare Other

## 2022-11-25 DIAGNOSIS — R7303 Prediabetes: Secondary | ICD-10-CM | POA: Diagnosis not present

## 2022-11-25 DIAGNOSIS — E7800 Pure hypercholesterolemia, unspecified: Secondary | ICD-10-CM

## 2022-11-25 LAB — COMPREHENSIVE METABOLIC PANEL
ALT: 9 U/L (ref 0–35)
AST: 20 U/L (ref 0–37)
Albumin: 4.4 g/dL (ref 3.5–5.2)
Alkaline Phosphatase: 63 U/L (ref 39–117)
BUN: 27 mg/dL — ABNORMAL HIGH (ref 6–23)
CO2: 32 mEq/L (ref 19–32)
Calcium: 9.9 mg/dL (ref 8.4–10.5)
Chloride: 98 mEq/L (ref 96–112)
Creatinine, Ser: 1.16 mg/dL (ref 0.40–1.20)
GFR: 46.65 mL/min — ABNORMAL LOW (ref >60.00)
Glucose, Bld: 100 mg/dL — ABNORMAL HIGH (ref 70–99)
Potassium: 3.9 mEq/L (ref 3.5–5.1)
Sodium: 137 mEq/L (ref 135–145)
Total Bilirubin: 0.6 mg/dL (ref 0.2–1.2)
Total Protein: 7.1 g/dL (ref 6.0–8.3)

## 2022-11-25 LAB — LIPID PANEL
Cholesterol: 207 mg/dL — ABNORMAL HIGH (ref 0–200)
HDL: 43 mg/dL (ref >39.00)
LDL Cholesterol: 133 mg/dL — ABNORMAL HIGH (ref 0–99)
NonHDL: 164.01
Total CHOL/HDL Ratio: 5
Triglycerides: 157 mg/dL — ABNORMAL HIGH (ref 0.0–149.0)
VLDL: 31.4 mg/dL (ref 0.0–40.0)

## 2022-11-25 LAB — HEMOGLOBIN A1C: Hgb A1c MFr Bld: 5.9 % (ref 4.6–6.5)

## 2022-11-25 NOTE — Progress Notes (Signed)
No critical labs need to be addressed urgently. We will discuss labs in detail at upcoming office visit.   

## 2022-12-02 ENCOUNTER — Encounter: Payer: Self-pay | Admitting: Family Medicine

## 2022-12-02 ENCOUNTER — Ambulatory Visit (INDEPENDENT_AMBULATORY_CARE_PROVIDER_SITE_OTHER): Payer: Medicare Other | Admitting: Family Medicine

## 2022-12-02 VITALS — BP 120/86 | HR 62 | Temp 97.7°F | Ht 63.0 in | Wt 194.2 lb

## 2022-12-02 DIAGNOSIS — I10 Essential (primary) hypertension: Secondary | ICD-10-CM

## 2022-12-02 DIAGNOSIS — R7303 Prediabetes: Secondary | ICD-10-CM

## 2022-12-02 DIAGNOSIS — Z0000 Encounter for general adult medical examination without abnormal findings: Secondary | ICD-10-CM | POA: Diagnosis not present

## 2022-12-02 DIAGNOSIS — E7800 Pure hypercholesterolemia, unspecified: Secondary | ICD-10-CM | POA: Diagnosis not present

## 2022-12-02 DIAGNOSIS — T466X5A Adverse effect of antihyperlipidemic and antiarteriosclerotic drugs, initial encounter: Secondary | ICD-10-CM

## 2022-12-02 DIAGNOSIS — G720 Drug-induced myopathy: Secondary | ICD-10-CM

## 2022-12-02 DIAGNOSIS — E669 Obesity, unspecified: Secondary | ICD-10-CM

## 2022-12-02 NOTE — Assessment & Plan Note (Signed)
History of body pain and joint pain with atorvastatin 10 mg daily in the past.  She is not interested in repeating a trial or changing dosing/medication type.

## 2022-12-02 NOTE — Assessment & Plan Note (Signed)
Encouraged exercise, weight loss, healthy eating habits. ? ?

## 2022-12-02 NOTE — Assessment & Plan Note (Signed)
Chronic, stable control

## 2022-12-02 NOTE — Assessment & Plan Note (Signed)
Chronic, inadequate control with increased risk of cardiovascular disease: 10-year cardiovascular disease risk at 20%.  She refuses statin medication.  She did have joint/muscle pain with atorvastatin 10 mg daily.  She will try to get back on track with regular exercise and low-cholesterol diet.  We reviewed this in detail today.  She will also restart red yeast rice 1200 mg p.o. twice daily.  I recommended repeating cholesterol checked in 3 to 6 months.

## 2022-12-02 NOTE — Assessment & Plan Note (Signed)
Stable, chronic.  Continue current medication.  atenolol chlorthalidone 100/25 mg daily

## 2022-12-02 NOTE — Patient Instructions (Addendum)
Work on keeping up with water intake. Stop aleve. Avoid ibuprofen as well.  Use tylenol for knee pain 2 ES tablets twice daily prn for pain.  Restart red yeast rice 1200 mg  twice daily. Work on low cholesterol diet.  Can return in 3-6 months for cholesterol recheck if work hard at lifestyle change.  Call GI to set up colonoscopy. [facility]  [phone]    Please call the location of your choice from the menu below to schedule your Mammogram and/or Bone Density appointment.    [facility]                      Phone:  [phone], [place] [address]                                                             Services: Traditional and 3D Mammogram, [facility]                 Phone: [phone] [address], [place] [address]    Service: Bone Density ONLY   *this site does NOT perform mammograms  [place]                        Phone:  [phone] [address], [place] [address]                                            Services:  3D Mammogram and [place] at [facility]   Phone:  [phone]   [address], [place] [address]  Services: 3D Mammogram and Bone Density  [facility] [place] ([facility])  Phone:  [phone]   [address]                        [place], [place] [address]                                              Services:  3D Mammogram and Bone Density  

## 2022-12-02 NOTE — Progress Notes (Signed)
Patient ID: Jade Mcguire, female    DOB: 11-Oct-1951, 71 y.o.   MRN: [id number]  This visit was conducted in person.  BP (!) 140/90   Pulse 62   Temp 97.7 F (36.5 C) (Oral)   Ht '5\' 3"'$  (1.6 m)   Wt 194 lb 4 oz (88.1 kg)   SpO2 98%   BMI 34.41 kg/m    CC:  Chief Complaint  Patient presents with   Annual Exam    Part 2--(MWV 04/26/22)    Subjective:   HPI: Jade Mcguire is a 71 y.o. female presenting on 12/02/2022 for Annual Exam (Part 2--(MWV 04/26/22))  The patient presents for annual medicare wellness, complete physical and review of chronic health problems. He/She also has the following acute concerns today: none  Elevated Cholesterol: Poor control cholesterol off atorvastatin.  Did not tolerate 10 mg daily given joint pain.  Currently taking red yeast rice..  Cardiovascular disease 10-year risk has increased from 16 to 20%. Lab Results  Component Value Date   CHOL 207 (H) 11/25/2022   HDL 43.00 11/25/2022   LDLCALC 133 (H) 11/25/2022   LDLDIRECT 127.0 01/07/2020   TRIG 157.0 (H) 11/25/2022   CHOLHDL 5 11/25/2022  The 10-year ASCVD risk score (Arnett DK, et al., 2019) is: 20.6%   Values used to calculate the score:     Age: 5 years     Sex: Female     Is Non-Hispanic African American: No     Diabetic: No     Tobacco smoker: No     Systolic Blood Pressure: 132 mmHg     Is BP treated: Yes     HDL Cholesterol: 43 mg/dL     Total Cholesterol: 207 mg/dL Using medications without problems: Muscle aches:  Diet compliance: poor Exercise: NONE Other complaints:   Hypertension:     On atenolol chlorthalidone 100/25 mg daily BP Readings from Last 3 Encounters:  12/02/22 (!) 140/90  04/26/21 130/80  01/14/20 130/80  Borderline control today despite following: 100/25 mg daily Using medication without problems or lightheadedness:  none Chest pain with exertion: none Edema:none Short of breath:none Average home BPs: 130/80 Other issues:  Prediabetes stable  control Lab Results  Component Value Date   HGBA1C 5.9 11/25/2022        Relevant past medical, surgical, family and social history reviewed and updated as indicated. Interim medical history since our last visit reviewed. Allergies and medications reviewed and updated. Outpatient Medications Prior to Visit  Medication Sig Dispense Refill   Ascorbic Acid (VITAMIN C PO) Take 1 tablet by mouth daily.     atenolol-chlorthalidone (TENORETIC) 100-25 MG tablet TAKE 1 TABLET BY MOUTH EVERY DAY 90 tablet 0   cholecalciferol (VITAMIN D) 400 UNITS TABS tablet Take 400 Units by mouth 2 (two) times daily.     Glucosamine Sulfate 1000 MG TABS Take 1 tablet by mouth daily.     Multiple Vitamin (MULTIVITAMIN) tablet Take 1 tablet by mouth daily.     Omega-3 Fatty Acids (FISH OIL) 1000 MG CAPS Take 1 capsule by mouth daily.     Red Yeast Rice 600 MG CAPS Take 1,200 mg by mouth 2 (two) times daily.     No facility-administered medications prior to visit.     Per HPI unless specifically indicated in ROS section below Review of Systems  Constitutional:  Negative for fatigue and fever.  HENT:  Negative for congestion.   Eyes:  Negative for pain.  Respiratory:  Negative for cough and shortness of breath.   Cardiovascular:  Negative for chest pain, palpitations and leg swelling.  Gastrointestinal:  Negative for abdominal pain.  Genitourinary:  Negative for dysuria and vaginal bleeding.  Musculoskeletal:  Negative for back pain.  Neurological:  Negative for syncope, light-headedness and headaches.  Psychiatric/Behavioral:  Negative for dysphoric mood.    Objective:  BP (!) 140/90   Pulse 62   Temp 97.7 F (36.5 C) (Oral)   Ht '5\' 3"'$  (1.6 m)   Wt 194 lb 4 oz (88.1 kg)   SpO2 98%   BMI 34.41 kg/m   Wt Readings from Last 3 Encounters:  12/02/22 194 lb 4 oz (88.1 kg)  04/26/22 192 lb (87.1 kg)  04/26/21 192 lb 4 oz (87.2 kg)      Physical Exam Vitals and nursing note reviewed.   Constitutional:      General: She is not in acute distress.    Appearance: Normal appearance. She is well-developed. She is obese. She is not ill-appearing or toxic-appearing.  HENT:     Head: Normocephalic.     Right Ear: Hearing, tympanic membrane, ear canal and external ear normal.     Left Ear: Hearing, tympanic membrane, ear canal and external ear normal.     Nose: Nose normal.  Eyes:     General: Lids are normal. Lids are everted, no foreign bodies appreciated.     Conjunctiva/sclera: Conjunctivae normal.     Pupils: Pupils are equal, round, and reactive to light.  Neck:     Thyroid: No thyroid mass or thyromegaly.     Vascular: No carotid bruit.     Trachea: Trachea normal.  Cardiovascular:     Rate and Rhythm: Normal rate and regular rhythm.     Heart sounds: Normal heart sounds, S1 normal and S2 normal. No murmur heard.    No gallop.  Pulmonary:     Effort: Pulmonary effort is normal. No respiratory distress.     Breath sounds: Normal breath sounds. No wheezing, rhonchi or rales.  Abdominal:     General: Bowel sounds are normal. There is no distension or abdominal bruit.     Palpations: Abdomen is soft. There is no fluid wave or mass.     Tenderness: There is no abdominal tenderness. There is no guarding or rebound.     Hernia: No hernia is present.  Musculoskeletal:     Cervical back: Normal range of motion and neck supple.  Lymphadenopathy:     Cervical: No cervical adenopathy.  Skin:    General: Skin is warm and dry.     Findings: No rash.  Neurological:     Mental Status: She is alert.     Cranial Nerves: No cranial nerve deficit.     Sensory: No sensory deficit.  Psychiatric:        Mood and Affect: Mood is not anxious or depressed.        Speech: Speech normal.        Behavior: Behavior normal. Behavior is cooperative.        Judgment: Judgment normal.       Results for orders placed or performed in visit on 11/25/22  Comprehensive metabolic panel   Result Value Ref Range   Sodium 137 135 - 145 mEq/L   Potassium 3.9 3.5 - 5.1 mEq/L   Chloride 98 96 - 112 mEq/L   CO2 32 19 - 32 mEq/L   Glucose, Bld 100 (H) 70 - 99 mg/dL  BUN 27 (H) 6 - 23 mg/dL   Creatinine, Ser 1.16 0.40 - 1.20 mg/dL   Total Bilirubin 0.6 0.2 - 1.2 mg/dL   Alkaline Phosphatase 63 39 - 117 U/L   AST 20 0 - 37 U/L   ALT 9 0 - 35 U/L   Total Protein 7.1 6.0 - 8.3 g/dL   Albumin 4.4 3.5 - 5.2 g/dL   GFR 46.65 (L) >60.00 mL/min   Calcium 9.9 8.4 - 10.5 mg/dL  Lipid panel  Result Value Ref Range   Cholesterol 207 (H) 0 - 200 mg/dL   Triglycerides 157.0 (H) 0.0 - 149.0 mg/dL   HDL 43.00 >39.00 mg/dL   VLDL 31.4 0.0 - 40.0 mg/dL   LDL Cholesterol 133 (H) 0 - 99 mg/dL   Total CHOL/HDL Ratio 5    NonHDL 164.01   Hemoglobin A1c  Result Value Ref Range   Hgb A1c MFr Bld 5.9 4.6 - 6.5 %     COVID 19 screen:  No recent travel or known exposure to COVID19 The patient denies respiratory symptoms of COVID 19 at this time. The importance of social distancing was discussed today.   Assessment and Plan The patient's preventative maintenance and recommended screening tests for an annual wellness exam were reviewed in full today. Brought up to date unless services declined.  Counselled on the importance of diet, exercise, and its role in overall health and mortality. The patient's FH and SH was reviewed, including their home life, tobacco status, and drug and alcohol status.    Vaccines: Uptodate flu.  Discussed COVID19 vaccine side effects and benefits. Strongly encouraged the patient to get the vaccine. Questions answered.  Shingrix discussed.. not interested.  Due for Td Mammo:12/2018 stable, plan q 2 years... DUE.  DEXA 12/2018 stable osteopenia, mother with osteoporosis Vit D nml. Repeat in 5 years.  Pap/DVE: partial hysterectomy for fibroids,  No fam hx ovarian Ca. No DVE indicated. Colon:   10/2012 Dr. Fuller Plan, repeat in 10 years DUE NOW Former smoker, 33 pack  year hx, quit 2013. No daily cough, no SOB. Last spiro nml 2012  She refuses lung cancer CT screening again this year.. refused again 2023 Hep C:   done   Problem List Items Addressed This Visit     Essential hypertension    Stable, chronic.  Continue current medication.  atenolol chlorthalidone 100/25 mg daily             Hyperlipidemia    Chronic, inadequate control with increased risk of cardiovascular disease: 10-year cardiovascular disease risk at 20%.  She refuses statin medication.  She did have joint/muscle pain with atorvastatin 10 mg daily.  She will try to get back on track with regular exercise and low-cholesterol diet.  We reviewed this in detail today.  She will also restart red yeast rice 1200 mg p.o. twice daily.  I recommended repeating cholesterol checked in 3 to 6 months.      Obesity (BMI 30.0-34.9)    Encouraged exercise, weight loss, healthy eating habits.       Prediabetes    Chronic, stable control      Statin myopathy    History of body pain and joint pain with atorvastatin 10 mg daily in the past.  She is not interested in repeating a trial or changing dosing/medication type.      Other Visit Diagnoses     Routine general medical examination at a health care facility    -  Primary  Gates Jividen, MD   

## 2023-01-30 ENCOUNTER — Other Ambulatory Visit: Payer: Self-pay | Admitting: Family Medicine

## 2023-03-15 ENCOUNTER — Telehealth: Payer: Self-pay | Admitting: Family Medicine

## 2023-03-15 NOTE — Telephone Encounter (Signed)
Contacted Jade Mcguire to RE-schedule their annual wellness visit. Appointment made for 04/28/23. Left a detailed message informing patient of appt changed to 05/02/2023.   [place] Direct Dial: [phone]

## 2023-03-16 ENCOUNTER — Other Ambulatory Visit: Payer: Self-pay | Admitting: Family Medicine

## 2023-03-16 DIAGNOSIS — Z1231 Encounter for screening mammogram for malignant neoplasm of breast: Secondary | ICD-10-CM

## 2023-04-18 ENCOUNTER — Telehealth: Payer: Self-pay | Admitting: Family Medicine

## 2023-04-18 NOTE — Telephone Encounter (Signed)
Contacted Jade Mcguire to schedule their annual wellness visit. Appointment made for 05/03/2023.  [facility] TE[facility] Direct Dial: [phone]

## 2023-05-09 ENCOUNTER — Telehealth: Payer: Self-pay | Admitting: *Deleted

## 2023-05-09 NOTE — Telephone Encounter (Signed)
Patient returned call,I scheduled her for pre op surgical clearance

## 2023-05-09 NOTE — Telephone Encounter (Signed)
Left message for Jade Mcguire to return call to office.  We received a Surgical Clearance form from [place].  Patient needs to schedule office visit with Dr. Ermalene Searing to do a Pre-Op Clearance.

## 2023-05-10 ENCOUNTER — Encounter: Payer: Self-pay | Admitting: Family Medicine

## 2023-05-10 ENCOUNTER — Ambulatory Visit (INDEPENDENT_AMBULATORY_CARE_PROVIDER_SITE_OTHER): Payer: Medicare Other | Admitting: Family Medicine

## 2023-05-10 VITALS — BP 102/80 | HR 64 | Temp 97.8°F | Ht 63.0 in | Wt 191.0 lb

## 2023-05-10 DIAGNOSIS — Z01818 Encounter for other preprocedural examination: Secondary | ICD-10-CM

## 2023-05-10 NOTE — Progress Notes (Signed)
Patient ID: Jade Mcguire, female    DOB: 1951/06/19, 72 y.o.   MRN: [id number]  This visit was conducted in person.  BP 102/80 (BP Location: Left Arm, Patient Position: Sitting, Cuff Size: Normal)   Pulse 64   Temp 97.8 F (36.6 C) (Temporal)   Ht 5\' 3"  (1.6 m)   Wt 191 lb (86.6 kg)   SpO2 98%   BMI 33.83 kg/m    CC:  Chief Complaint  Patient presents with  . Pre-op Exam    On Left knee; date pending.     Subjective:   HPI: Jade Mcguire is a 72 y.o. female presenting on 05/10/2023 for Pre-op Exam (On Left knee; date pending. )   Left total knee arthroplasty  Spinal anesthesia. Date of surgery pending Surgeon Dr. Blanchie Dessert  Pertinent past medical history former smoker, prediabetes, BMI 33, hypertension and hyperlipidemia.  She denies chest pain, shortness of breath or lightheadedness. No urinary symptoms.  Lab Results  Component Value Date   HGBA1C 5.9 11/25/2022    Hypertension:  Well-controlled on atenolol/chlorthalidone 100/25 mg p.o. daily  BP Readings from Last 3 Encounters:  05/10/23 102/80  12/02/22 120/86  04/26/21 130/80  Using medication without problems or lightheadedness: none Chest pain with exertion:none Edema:none Short of breath:none Average home BPs: Other issues: She is not on any anticoagulants.   Able to do about 3-4 mets of activity without DOE.  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   Needs handicap form for knee pain that limits ambulation.   Last surgery in 90s.. no issues. No issues with intubation.  .       Relevant past medical, surgical, family and social history reviewed and updated as indicated. Interim medical history since our last visit reviewed. Allergies and medications reviewed and updated. Outpatient Medications Prior to Visit  Medication Sig Dispense Refill  . Ascorbic Acid (VITAMIN C PO) Take 1 tablet by mouth daily.    Marland Kitchen atenolol-chlorthalidone  (TENORETIC) 100-25 MG tablet TAKE 1 TABLET BY MOUTH EVERY DAY 90 tablet 3  . cholecalciferol (VITAMIN D) 400 UNITS TABS tablet Take 400 Units by mouth 2 (two) times daily.    . Glucosamine Sulfate 1000 MG TABS Take 1 tablet by mouth daily.    . Multiple Vitamin (MULTIVITAMIN) tablet Take 1 tablet by mouth daily.    . Omega-3 Fatty Acids (FISH OIL) 1000 MG CAPS Take 1 capsule by mouth daily.     No facility-administered medications prior to visit.     Per HPI unless specifically indicated in ROS section below Review of Systems Objective:  BP 102/80 (BP Location: Left Arm, Patient Position: Sitting, Cuff Size: Normal)   Pulse 64   Temp 97.8 F (36.6 C) (Temporal)   Ht 5\' 3"  (1.6 m)   Wt 191 lb (86.6 kg)   SpO2 98%   BMI 33.83 kg/m   Wt Readings from Last 3 Encounters:  05/10/23 191 lb (86.6 kg)  12/02/22 194 lb 4 oz (88.1 kg)  04/26/22 192 lb (87.1 kg)      Physical Exam    Results for orders placed or performed in visit on 11/25/22  Comprehensive metabolic panel  Result Value Ref Range   Sodium 137 135 - 145 mEq/L   Potassium 3.9 3.5 - 5.1 mEq/L   Chloride 98 96 - 112 mEq/L   CO2 32 19 - 32 mEq/L   Glucose, Bld 100 (  H) 70 - 99 mg/dL   BUN 27 (H) 6 - 23 mg/dL   Creatinine, Ser 6.96 0.40 - 1.20 mg/dL   Total Bilirubin 0.6 0.2 - 1.2 mg/dL   Alkaline Phosphatase 63 39 - 117 U/L   AST 20 0 - 37 U/L   ALT 9 0 - 35 U/L   Total Protein 7.1 6.0 - 8.3 g/dL   Albumin 4.4 3.5 - 5.2 g/dL   GFR 29.52 (L) >84.13 mL/min   Calcium 9.9 8.4 - 10.5 mg/dL  Lipid panel  Result Value Ref Range   Cholesterol 207 (H) 0 - 200 mg/dL   Triglycerides 244.0 (H) 0.0 - 149.0 mg/dL   HDL 10.27 >25.36 mg/dL   VLDL 64.4 0.0 - 03.4 mg/dL   LDL Cholesterol 742 (H) 0 - 99 mg/dL   Total CHOL/HDL Ratio 5    NonHDL 164.01   Hemoglobin A1c  Result Value Ref Range   Hgb A1c MFr Bld 5.9 4.6 - 6.5 %    Assessment and Plan  There are no diagnoses linked to this encounter.  No follow-ups on file.    Kerby Nora, MD

## 2023-05-11 DIAGNOSIS — Z01818 Encounter for other preprocedural examination: Secondary | ICD-10-CM | POA: Insufficient documentation

## 2023-05-11 NOTE — Assessment & Plan Note (Signed)
Upcoming left total knee arthroplasty with spinal anesthesia by Dr. Blanchie Dessert.   Able to do about 3-4 mets of activity without DOE.  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  Lab work will be performed by preop team.  No clear indication for EKG, chest x-ray or urinalysis.  Low to moderate risk surgery in low risk patient.  Encouraged incentive spirometry after procedure, DVT prophylaxis, and encouraged patient to be engaged with active physical therapy as soon as recommended.

## 2023-11-21 ENCOUNTER — Telehealth: Payer: Self-pay | Admitting: *Deleted

## 2023-11-21 DIAGNOSIS — E7800 Pure hypercholesterolemia, unspecified: Secondary | ICD-10-CM

## 2023-11-21 DIAGNOSIS — R7303 Prediabetes: Secondary | ICD-10-CM

## 2023-11-21 NOTE — Telephone Encounter (Signed)
-----   Message from Alvina Chou sent at 11/21/2023  3:31 PM EST ----- Regarding: Lab orders for [facility], 12/07/23 Patient is scheduled for CPX labs, please order future labs, Thanks , Camelia Eng

## 2023-12-07 ENCOUNTER — Other Ambulatory Visit (INDEPENDENT_AMBULATORY_CARE_PROVIDER_SITE_OTHER): Payer: Medicare Other

## 2023-12-07 DIAGNOSIS — R7303 Prediabetes: Secondary | ICD-10-CM

## 2023-12-07 DIAGNOSIS — E7800 Pure hypercholesterolemia, unspecified: Secondary | ICD-10-CM | POA: Diagnosis not present

## 2023-12-07 LAB — COMPREHENSIVE METABOLIC PANEL
ALT: 9 U/L (ref 0–35)
AST: 19 U/L (ref 0–37)
Albumin: 4.3 g/dL (ref 3.5–5.2)
Alkaline Phosphatase: 70 U/L (ref 39–117)
BUN: 19 mg/dL (ref 6–23)
CO2: 31 meq/L (ref 19–32)
Calcium: 9.6 mg/dL (ref 8.4–10.5)
Chloride: 98 meq/L (ref 96–112)
Creatinine, Ser: 1.07 mg/dL (ref 0.40–1.20)
GFR: 51.03 mL/min — ABNORMAL LOW (ref >60.00)
Glucose, Bld: 108 mg/dL — ABNORMAL HIGH (ref 70–99)
Potassium: 3.6 meq/L (ref 3.5–5.1)
Sodium: 138 meq/L (ref 135–145)
Total Bilirubin: 0.7 mg/dL (ref 0.2–1.2)
Total Protein: 7.1 g/dL (ref 6.0–8.3)

## 2023-12-07 LAB — LIPID PANEL
Cholesterol: 200 mg/dL (ref 0–200)
HDL: 44.3 mg/dL (ref >39.00)
LDL Cholesterol: 114 mg/dL — ABNORMAL HIGH (ref 0–99)
NonHDL: 155.62
Total CHOL/HDL Ratio: 5
Triglycerides: 207 mg/dL — ABNORMAL HIGH (ref 0.0–149.0)
VLDL: 41.4 mg/dL — ABNORMAL HIGH (ref 0.0–40.0)

## 2023-12-07 LAB — HEMOGLOBIN A1C: Hgb A1c MFr Bld: 5.8 % (ref 4.6–6.5)

## 2023-12-07 NOTE — Progress Notes (Signed)
No critical labs need to be addressed urgently. We will discuss labs in detail at upcoming office visit.   

## 2023-12-14 ENCOUNTER — Encounter: Payer: Medicare Other | Admitting: Family Medicine

## 2023-12-26 ENCOUNTER — Encounter: Payer: Medicare Other | Admitting: Family Medicine

## 2024-01-05 ENCOUNTER — Encounter: Payer: Medicare Other | Admitting: Family Medicine

## 2024-01-12 ENCOUNTER — Encounter: Payer: Self-pay | Admitting: Family Medicine

## 2024-01-12 ENCOUNTER — Ambulatory Visit (INDEPENDENT_AMBULATORY_CARE_PROVIDER_SITE_OTHER): Payer: Medicare Other | Admitting: Family Medicine

## 2024-01-12 VITALS — BP 120/80 | HR 64 | Temp 97.9°F | Ht 62.5 in | Wt 190.4 lb

## 2024-01-12 DIAGNOSIS — I10 Essential (primary) hypertension: Secondary | ICD-10-CM

## 2024-01-12 DIAGNOSIS — Z0000 Encounter for general adult medical examination without abnormal findings: Secondary | ICD-10-CM

## 2024-01-12 DIAGNOSIS — R7303 Prediabetes: Secondary | ICD-10-CM | POA: Diagnosis not present

## 2024-01-12 DIAGNOSIS — T466X5A Adverse effect of antihyperlipidemic and antiarteriosclerotic drugs, initial encounter: Secondary | ICD-10-CM

## 2024-01-12 DIAGNOSIS — M8580 Other specified disorders of bone density and structure, unspecified site: Secondary | ICD-10-CM

## 2024-01-12 DIAGNOSIS — E7800 Pure hypercholesterolemia, unspecified: Secondary | ICD-10-CM

## 2024-01-12 DIAGNOSIS — E66811 Obesity, class 1: Secondary | ICD-10-CM

## 2024-01-12 DIAGNOSIS — M791 Myalgia, unspecified site: Secondary | ICD-10-CM | POA: Insufficient documentation

## 2024-01-12 MED ORDER — ATENOLOL-CHLORTHALIDONE 100-25 MG PO TABS: 1.0000 | ORAL_TABLET | Freq: Every day | ORAL | 3 refills | Status: DC

## 2024-01-12 NOTE — Assessment & Plan Note (Signed)
SE to prescription statin in past.

## 2024-01-12 NOTE — Assessment & Plan Note (Signed)
Encouraged exercise, weight loss, healthy eating habits. ? ?

## 2024-01-12 NOTE — Assessment & Plan Note (Signed)
Stable, chronic.  Continue current medication.  atenolol chlorthalidone 100/25 mg daily

## 2024-01-12 NOTE — Progress Notes (Signed)
Patient ID: Jade Mcguire, female    DOB: 04-28-1951, 73 y.o.   MRN: [id number]  This visit was conducted in person.  BP 120/80 (BP Location: Left Arm, Patient Position: Sitting, Cuff Size: Large)   Pulse 64   Temp 97.9 F (36.6 C) (Temporal)   Ht 5' 2.5" (1.588 m)   Wt 190 lb 6 oz (86.4 kg)   SpO2 98%   BMI 34.27 kg/m    CC:  Chief Complaint  Patient presents with   Medicare Wellness    Subjective:   HPI: Jade Mcguire is a 73 y.o. female presenting on 01/12/2024 for Medicare Wellness  The patient presents for annual medicare wellness, complete physical and review of chronic health problems. He/She also has the following acute concerns today: none  I have personally reviewed the Medicare Annual Wellness questionnaire and have noted 1. The patient's medical and social history 2. Their use of alcohol, tobacco or illicit drugs 3. Their current medications and supplements 4. The patient's functional ability including ADL's, fall risks, home safety risks and hearing or visual             impairment. 5. Diet and physical activities 6. Evidence for depression or mood disorders 7.         Updated provider list Cognitive evaluation was performed and recorded on pt medicare questionnaire form. The patients weight, height, BMI and visual acuity have been recorded in the chart   I have made referrals, counseling and provided education to the patient based review of the above and I have provided the pt with a written personalized care plan for preventive services.   Documentation of this information was scanned into the electronic record under the media tab.   Advance directives and end of life planning reviewed in detail with patient and documented in EMR. Patient given handout on advance care directives if needed. HCPOA and living will updated if needed.  Hearing Screening  Method: Audiometry   500Hz  1000Hz  2000Hz  4000Hz   Right ear 20 20 20 20   Left ear 20 20 20 20    Vision  Screening   Right eye Left eye Both eyes  Without correction     With correction 20/30 20/25 20/25     No falls in last 12 months.  Flowsheet Row Office Visit from [facility]/12/2024 in [facility]  PHQ-2 Total Score 0        Elevated Cholesterol: Poor control cholesterol off atorvastatin.  Did not tolerate 10 mg daily given joint pain.  Currently taking red yeast rice 600 mg once daily  Cardiovascular disease 10-year risk 18% Lab Results  Component Value Date   CHOL 200 12/07/2023   HDL 44.30 12/07/2023   LDLCALC 114 (H) 12/07/2023   LDLDIRECT 127.0 01/07/2020   TRIG 207.0 (H) 12/07/2023   CHOLHDL 5 12/07/2023  The 10-year ASCVD risk score (Arnett DK, et al., 2019) is: 18.7%   Values used to calculate the score:     Age: 28 years     Sex: Female     Is Non-Hispanic African American: No     Diabetic: No     Tobacco smoker: No     Systolic Blood Pressure: 120 mmHg     Is BP treated: Yes     HDL Cholesterol: 44.3 mg/dL     Total Cholesterol: 200 mg/dL Using medications without problems: Muscle aches:  Diet compliance:  improving Exercise: walking more since knee surgery. Other complaints:  Wt Readings from Last 3 Encounters:  01/12/24 190 lb 6 oz (86.4 kg)  05/10/23 191 lb (86.6 kg)  12/02/22 194 lb 4 oz (88.1 kg)     Hypertension:     On atenolol chlorthalidone 100/25 mg daily BP Readings from Last 3 Encounters:  01/12/24 120/80  05/10/23 102/80  12/02/22 120/86  Borderline control today despite following: 100/25 mg daily Using medication without problems or lightheadedness:  none Chest pain with exertion: none Edema:none Short of breath:none Average home BPs:  Other issues:  Prediabetes stable control Lab Results  Component Value Date   HGBA1C 5.8 12/07/2023        Relevant past medical, surgical, family and social history reviewed and updated as indicated. Interim medical history since our last visit reviewed. Allergies  and medications reviewed and updated. Outpatient Medications Prior to Visit  Medication Sig Dispense Refill   Ascorbic Acid (VITAMIN C PO) Take 1 tablet by mouth daily.     cholecalciferol (VITAMIN D) 400 UNITS TABS tablet Take 400 Units by mouth 2 (two) times daily.     Glucosamine Sulfate 1000 MG TABS Take 1 tablet by mouth daily.     Multiple Vitamin (MULTIVITAMIN) tablet Take 1 tablet by mouth daily.     Omega-3 Fatty Acids (FISH OIL) 1000 MG CAPS Take 1 capsule by mouth daily.     Red Yeast Rice 600 MG CAPS Take 600 mg by mouth 2 (two) times daily.     atenolol-chlorthalidone (TENORETIC) 100-25 MG tablet TAKE 1 TABLET BY MOUTH EVERY DAY 90 tablet 3   No facility-administered medications prior to visit.     Per HPI unless specifically indicated in ROS section below Review of Systems  Constitutional:  Negative for fatigue and fever.  HENT:  Negative for congestion.   Eyes:  Negative for pain.  Respiratory:  Negative for cough and shortness of breath.   Cardiovascular:  Negative for chest pain, palpitations and leg swelling.  Gastrointestinal:  Negative for abdominal pain.  Genitourinary:  Negative for dysuria and vaginal bleeding.  Musculoskeletal:  Negative for back pain.  Neurological:  Negative for syncope, light-headedness and headaches.  Psychiatric/Behavioral:  Negative for dysphoric mood.    Objective:  BP 120/80 (BP Location: Left Arm, Patient Position: Sitting, Cuff Size: Large)   Pulse 64   Temp 97.9 F (36.6 C) (Temporal)   Ht 5' 2.5" (1.588 m)   Wt 190 lb 6 oz (86.4 kg)   SpO2 98%   BMI 34.27 kg/m   Wt Readings from Last 3 Encounters:  01/12/24 190 lb 6 oz (86.4 kg)  05/10/23 191 lb (86.6 kg)  12/02/22 194 lb 4 oz (88.1 kg)      Physical Exam Vitals and nursing note reviewed.  Constitutional:      General: She is not in acute distress.    Appearance: Normal appearance. She is well-developed. She is obese. She is not ill-appearing or toxic-appearing.   HENT:     Head: Normocephalic.     Right Ear: Hearing, tympanic membrane, ear canal and external ear normal.     Left Ear: Hearing, tympanic membrane, ear canal and external ear normal.     Nose: Nose normal.  Eyes:     General: Lids are normal. Lids are everted, no foreign bodies appreciated.     Conjunctiva/sclera: Conjunctivae normal.     Pupils: Pupils are equal, round, and reactive to light.  Neck:     Thyroid: No thyroid mass or thyromegaly.  Vascular: No carotid bruit.     Trachea: Trachea normal.  Cardiovascular:     Rate and Rhythm: Normal rate and regular rhythm.     Heart sounds: Normal heart sounds, S1 normal and S2 normal. No murmur heard.    No gallop.  Pulmonary:     Effort: Pulmonary effort is normal. No respiratory distress.     Breath sounds: Normal breath sounds. No wheezing, rhonchi or rales.  Abdominal:     General: Bowel sounds are normal. There is no distension or abdominal bruit.     Palpations: Abdomen is soft. There is no fluid wave or mass.     Tenderness: There is no abdominal tenderness. There is no guarding or rebound.     Hernia: No hernia is present.  Musculoskeletal:     Cervical back: Normal range of motion and neck supple.  Lymphadenopathy:     Cervical: No cervical adenopathy.  Skin:    General: Skin is warm and dry.     Findings: No rash.  Neurological:     Mental Status: She is alert.     Cranial Nerves: No cranial nerve deficit.     Sensory: No sensory deficit.  Psychiatric:        Mood and Affect: Mood is not anxious or depressed.        Speech: Speech normal.        Behavior: Behavior normal. Behavior is cooperative.        Judgment: Judgment normal.       Results for orders placed or performed in visit on 12/07/23  Lipid panel   Collection Time: 12/07/23  8:15 AM  Result Value Ref Range   Cholesterol 200 0 - 200 mg/dL   Triglycerides 664.4 (H) 0.0 - 149.0 mg/dL   HDL 03.47 >42.59 mg/dL   VLDL 56.3 (H) 0.0 - 87.5 mg/dL    LDL Cholesterol 643 (H) 0 - 99 mg/dL   Total CHOL/HDL Ratio 5    NonHDL 155.62   Hemoglobin A1c   Collection Time: 12/07/23  8:15 AM  Result Value Ref Range   Hgb A1c MFr Bld 5.8 4.6 - 6.5 %  Comprehensive metabolic panel   Collection Time: 12/07/23  8:15 AM  Result Value Ref Range   Sodium 138 135 - 145 mEq/L   Potassium 3.6 3.5 - 5.1 mEq/L   Chloride 98 96 - 112 mEq/L   CO2 31 19 - 32 mEq/L   Glucose, Bld 108 (H) 70 - 99 mg/dL   BUN 19 6 - 23 mg/dL   Creatinine, Ser 3.29 0.40 - 1.20 mg/dL   Total Bilirubin 0.7 0.2 - 1.2 mg/dL   Alkaline Phosphatase 70 39 - 117 U/L   AST 19 0 - 37 U/L   ALT 9 0 - 35 U/L   Total Protein 7.1 6.0 - 8.3 g/dL   Albumin 4.3 3.5 - 5.2 g/dL   GFR 51.88 (L) >41.66 mL/min   Calcium 9.6 8.4 - 10.5 mg/dL     COVID 19 screen:  No recent travel or known exposure to COVID19 The patient denies respiratory symptoms of COVID 19 at this time. The importance of social distancing was discussed today.   Assessment and Plan The patient's preventative maintenance and recommended screening tests for an annual wellness exam were reviewed in full today. Brought up to date unless services declined.  Counselled on the importance of diet, exercise, and its role in overall health and mortality. The patient's FH and SH was reviewed,  including their home life, tobacco status, and drug and alcohol status.    Vaccines: Uptodate flu.   Shingrix discussed.. not interested.  Due for Td, refused. Mammo:12/2018 stable, plan q 2 years... DUE.  DEXA 12/2018 stable osteopenia, mother with osteoporosis Vit D nml. Repeat in 5 years... DUE   Pap/DVE: partial hysterectomy for fibroids,  No fam hx ovarian Ca. No DVE indicated. Colon:   10/2012 Dr. Russella Dar, repeat in 10 years, not indicated die to age. Former smoker, 33 pack year hx, quit 2013. No daily cough, no SOB. Last spiro nml 2012  She refuses lung cancer CT screening again this year.. refused again 2023 Hep C:   done    Problem List Items Addressed This Visit     Essential hypertension   Stable, chronic.  Continue current medication.  atenolol chlorthalidone 100/25 mg daily             Relevant Medications   atenolol-chlorthalidone (TENORETIC) 100-25 MG tablet   Hyperlipidemia   Chronic, inadequate control  but somewhat improved with I start of exercsie and red yeast rice.  She will try to increase red yeast rice to 600 mg BID. Increased risk of cardiovascular disease: 10-year cardiovascular disease risk at 18%.  She refuses statin medication.  She did have joint/muscle pain with atorvastatin 10 mg daily.        Relevant Medications   atenolol-chlorthalidone (TENORETIC) 100-25 MG tablet   Myalgia due to statin    SE to prescription statin in past.      Obesity (BMI 30.0-34.9)   Encouraged exercise, weight loss, healthy eating habits.       Osteopenia   Relevant Orders   DG Bone Density   Prediabetes   Chronic, stable control      Other Visit Diagnoses       Medicare annual wellness visit, subsequent    -  Primary            Kerby Nora, MD

## 2024-01-12 NOTE — Assessment & Plan Note (Addendum)
Chronic, inadequate control  but somewhat improved with I start of exercsie and red yeast rice.  She will try to increase red yeast rice to 600 mg BID. Increased risk of cardiovascular disease: 10-year cardiovascular disease risk at 18%.  She refuses statin medication.  She did have joint/muscle pain with atorvastatin 10 mg daily.

## 2024-01-12 NOTE — Patient Instructions (Addendum)
Increased red yeast rice to 600 mg BID.  Keep up with low cholesterol diet and regular exercise. Please call the location of your choice from the menu below to schedule your Mammogram and/or Bone Density appointment.     [facility]   Phone:  [phone]   [address]                                                                            [place], [place] [address]                                            Services: 3D Mammogram and Bone Density

## 2024-01-12 NOTE — Assessment & Plan Note (Signed)
Chronic, stable control 

## 2025-01-10 ENCOUNTER — Ambulatory Visit: Payer: Self-pay | Admitting: *Deleted

## 2025-01-10 ENCOUNTER — Emergency Department
Admission: EM | Admit: 2025-01-10 | Discharge: 2025-01-10 | Disposition: A | Discharge status: Home / Self Care | Attending: Emergency Medicine | Admitting: Emergency Medicine

## 2025-01-10 ENCOUNTER — Other Ambulatory Visit: Payer: Self-pay

## 2025-01-10 ENCOUNTER — Emergency Department

## 2025-01-10 DIAGNOSIS — I10 Essential (primary) hypertension: Secondary | ICD-10-CM | POA: Diagnosis not present

## 2025-01-10 DIAGNOSIS — S42292A Other displaced fracture of upper end of left humerus, initial encounter for closed fracture: Secondary | ICD-10-CM | POA: Diagnosis not present

## 2025-01-10 DIAGNOSIS — S4992XA Unspecified injury of left shoulder and upper arm, initial encounter: Secondary | ICD-10-CM | POA: Diagnosis present

## 2025-01-10 DIAGNOSIS — W010XXA Fall on same level from slipping, tripping and stumbling without subsequent striking against object, initial encounter: Secondary | ICD-10-CM | POA: Diagnosis not present

## 2025-01-10 MED ORDER — OXYCODONE-ACETAMINOPHEN 5-325 MG PO TABS: 1.0000 | ORAL_TABLET | ORAL | 0 refills | Status: AC | PRN

## 2025-01-10 MED ORDER — OXYCODONE-ACETAMINOPHEN 5-325 MG PO TABS
1.0000 | ORAL_TABLET | Freq: Once | ORAL | Status: AC
  Administered 2025-01-10: 1 via ORAL
  Filled 2025-01-10: qty 1

## 2025-01-10 NOTE — Telephone Encounter (Signed)
 FYI Only or Action Required?: FYI only for provider: ED advised.  Patient was last seen in primary care on 01/12/2024 by Jade Greig BRAVO, MD.  Called Nurse Triage reporting Fall.  Symptoms began today.  Interventions attempted: Nothing.  Symptoms are: rapidly worsening.  Triage Disposition: Go to ED Now (Notify PCP), Go to ED Now (or PCP Triage)  Patient/caregiver understands and will follow disposition?: Yes                 Reason for Disposition  [1] Unable to get up until help (e.g., caregiver, family, friend) arrived AND [2] on the ground 1 hour or more  Injury (or injuries) that need emergency care  Answer Assessment - Initial Assessment Questions Recommended ED now. If sx worsening call 911. Denies hitting head no chest pain no difficulty breathing no sweating .     1. MECHANISM: How did the fall happen?     Playing with grandchild and Tripped over Bing Barr when attempted to step over doll and fell on left shoulder per patient's husband.  2. DOMESTIC VIOLENCE AND ELDER ABUSE SCREENING: Did you fall because someone pushed you or tried to hurt you? If Yes, ask: Are you safe now?     na 3. ONSET: When did the fall happen? (e.g., minutes, hours, or days ago)     1 hour ago  4. LOCATION: What part of the body hit the ground? (e.g., back, buttocks, head, hips, knees, hands, head, stomach)     Left shoulder denies hitting head 5. INJURY: Did you hurt (injure) yourself when you fell? If Yes, ask: What did you injure? Tell me more about this? (e.g., body area; type of injury; pain severity)     Yes unable to move left arm with out severe pain  6. PAIN: Is there any pain? If Yes, ask: How bad is the pain? (e.g., Scale 0-10; or none, mild,      10/10 7. SIZE: For cuts, bruises, or swelling, ask: How large is it? (e.g., inches or centimeters)      Unsure if swelling or bruising noted. No bleeding  8. PREGNANCY: Is there any chance you are  pregnant? When was your last menstrual period?     na 9. OTHER SYMPTOMS: Do you have any other symptoms? (e.g., dizziness, fever, weakness; new-onset or worsening).      Unable to move left shoulder severe pain  10. CAUSE: What do you think caused the fall (or falling)? (e.g., dizzy spell, tripped)       tripped  Protocols used: Falls and [facility]

## 2025-01-10 NOTE — ED Triage Notes (Signed)
 Pt c/o L shoulder pain post fall today. Pt was playing with her granddaughter and tripped over a toy falling onto her left shoulder. Pt has homemade sling. Pt reports pain is more localized to mid upper arm. Radial pulse normal.

## 2025-01-10 NOTE — ED Provider Notes (Signed)
 "  [facility] Provider Note    Event Date/Time   First MD Initiated Contact with Patient 01/10/25 1556     (approximate)   History   Chief Complaint Fall and Shoulder Injury   HPI  Jade Mcguire is a 74 y.o. female with past medical history of hypertension and hyperlipidemia who presents to the ED complaining of shoulder injury.  Patient reports that just prior to arrival she was playing with her granddaughter when she tripped over a toy, falling onto her left shoulder.  She had immediate onset of pain in the shoulder, denies hitting her head or losing consciousness.  She does not take a blood thinner and denies any difficulty moving her arm at the elbow or wrist.     Physical Exam   Triage Vital Signs: ED Triage Vitals  Encounter Vitals Group     BP 01/10/25 1421 (!) 191/73     Girls Systolic BP Percentile --      Girls Diastolic BP Percentile --      Boys Systolic BP Percentile --      Boys Diastolic BP Percentile --      Pulse Rate 01/10/25 1421 63     Resp 01/10/25 1421 17     Temp 01/10/25 1421 97.9 F (36.6 C)     Temp Source 01/10/25 1421 Oral     SpO2 01/10/25 1421 96 %     Weight 01/10/25 1421 191 lb (86.6 kg)     Height 01/10/25 1421 5' 2 (1.575 m)     Head Circumference --      Peak Flow --      Pain Score 01/10/25 1420 10     Pain Loc --      Pain Education --      Exclude from Growth Chart --     Most recent vital signs: Vitals:   01/10/25 1421  BP: (!) 191/73  Pulse: 63  Resp: 17  Temp: 97.9 F (36.6 C)  SpO2: 96%    Constitutional: Alert and oriented. Eyes: Conjunctivae are normal. Head: Atraumatic. Nose: No congestion/rhinnorhea. Mouth/Throat: Mucous membranes are moist.  Cardiovascular: Normal rate, regular rhythm. Grossly normal heart sounds.  2+ radial pulses bilaterally. Respiratory: Normal respiratory effort.  No retractions. Lungs CTAB. Gastrointestinal: Soft and nontender. No distention. Musculoskeletal:  No lower extremity tenderness nor edema.  Diffuse tenderness to palpation of the left shoulder with no obvious deformity.  No tenderness noted at the elbow or wrist. Neurologic:  Normal speech and language. No gross focal neurologic deficits are appreciated.    ED Results / Procedures / Treatments   Labs (all labs ordered are listed, but only abnormal results are displayed) Labs Reviewed - No data to display   RADIOLOGY Left shoulder x-ray reviewed and interpreted by me with fracture of the humeral head.  PROCEDURES:  Critical Care performed: No  Procedures   MEDICATIONS ORDERED IN ED: Medications  oxyCODONE -acetaminophen  (PERCOCET/ROXICET) 5-325 MG per tablet 1 tablet (1 tablet Oral Given 01/10/25 1626)     IMPRESSION / MDM / ASSESSMENT AND PLAN / ED COURSE  I reviewed the triage vital signs and the nursing notes.                              74 y.o. female with past medical history of hypertension and hyperlipidemia who presents to the ED complaining of fall with significant pain to her left shoulder.  Patient's presentation is most consistent with acute complicated illness / injury requiring diagnostic workup.  Differential diagnosis includes, but is not limited to, fracture, dislocation, contusion.  Patient nontoxic-appearing and in no acute distress, vital signs are remarkable for hypertension but otherwise reassuring.  She has significant tenderness to palpation of her left shoulder and x-ray shows fracture of the humeral head, cannot exclude subluxation per radiology.  No evidence of traumatic injury to her head, neck, elbow or wrist and she is neurovascularly intact distally.  We will treat pain with Percocet and place in sling, plan to discuss with orthopedics whether CT imaging is needed.  Case discussed with Dr. Cleotilde of orthopedics, who reviewed x-ray and does not feel CT imaging needed at this time.  He recommends discharge home in a shoulder immobilizer with  plan for orthopedic follow-up.  Pain improved on reassessment and patient remains neurovascularly intact distally.  She is appropriate for outpatient management, will prescribe course of pain medication.  She was counseled to return to the ED for new or worsening symptoms, patient and family agree with plan.      FINAL CLINICAL IMPRESSION(S) / ED DIAGNOSES   Final diagnoses:  Closed fracture of head of left humerus, initial encounter     Rx / DC Orders   ED Discharge Orders          Ordered    oxyCODONE -acetaminophen  (PERCOCET) 5-325 MG tablet  Every 4 hours PRN        01/10/25 1726             Note:  This document was prepared using Dragon voice recognition software and may include unintentional dictation errors.   Willo Dunnings, MD 01/10/25 1729  "

## 2025-01-22 ENCOUNTER — Telehealth: Payer: Self-pay | Admitting: *Deleted

## 2025-01-22 DIAGNOSIS — E7800 Pure hypercholesterolemia, unspecified: Secondary | ICD-10-CM

## 2025-01-22 DIAGNOSIS — R7303 Prediabetes: Secondary | ICD-10-CM

## 2025-01-22 NOTE — Telephone Encounter (Signed)
-----   Message from Harlene Du sent at 01/21/2025 12:33 PM EST ----- Regarding: Lab Thurs 02/06/25 Hello,  Patient is coming in for CPE/AWV labs. Can we get orders please.   Thanks

## 2025-01-25 ENCOUNTER — Other Ambulatory Visit: Payer: Self-pay | Admitting: Family Medicine

## 2025-01-29 ENCOUNTER — Ambulatory Visit: Payer: Self-pay

## 2025-01-29 NOTE — Telephone Encounter (Signed)
 FYI Only or Action Required?: FYI only for provider: appointment scheduled on 2/5.  Patient was last seen in primary care on 01/12/2024 by Avelina Greig BRAVO, MD.  Called Nurse Triage reporting Foot Swelling.  Symptoms began x 1 week.  Interventions attempted: OTC medications: Tylenol .  Symptoms are: gradually worsening.  Triage Disposition: See Physician Within 24 Hours  Patient/caregiver understands and will follow disposition?: Yes    Message from [facility] sent at 01/29/2025  2:14 PM EST  Summary: feet swelling and weeping   Reason for Triage: 3 weeks since left broke arm now retaining fluid feet swelling and weeping      Reason for Disposition  [1] MODERATE leg swelling (e.g., swelling extends up to knees) AND [2] new-onset or getting worse  Answer Assessment - Initial Assessment Questions 1. ONSET: When did the swelling start? (e.g., minutes, hours, days)     X 1 weeks   2. LOCATION: What part of the leg is swollen?  Are both legs swollen or just one leg?     BIL feet   3. SEVERITY: How bad is the swelling? (e.g., localized; mild, moderate, severe)     Severe   4. REDNESS: Is there redness or signs of infection?     Mild redness noted   5. PAIN: Is the swelling painful to touch? If Yes, ask: How painful is it?   (Scale 1-10; mild, moderate or severe)     Moderate    6. FEVER: Do you have a fever? If Yes, ask: What is it, how was it measured, and when did it start?      No   7. CAUSE: What do you think is causing the leg swelling?     Immobility   8. MEDICAL HISTORY: Do you have a history of blood clots (e.g., DVT), cancer, heart failure, kidney disease, or liver failure?     HTN  9. RECURRENT SYMPTOM: Have you had leg swelling before? If Yes, ask: When was the last time? What happened that time?     No   10. OTHER SYMPTOMS: Do you have any other symptoms? (e.g., chest pain, difficulty breathing)       No     Patient  called in to triage with complaints of BIL swelling. This has been ongoing for 3 weeks.  The patient stated her feet are retaining fluid due to being immobile since breaking her arm. BIL feet are weeping fluids.  For home care, the patient is taking Tylenol .  Appointment scheduled for further evaluation; Patient agrees with the plan of care, and will reach out if symptoms worsen or persist.  Protocols used: Leg Swelling and Edema-A-AH

## 2025-01-29 NOTE — Telephone Encounter (Signed)
 Noted.

## 2025-01-30 ENCOUNTER — Ambulatory Visit: Payer: Self-pay | Admitting: Family Medicine

## 2025-01-30 ENCOUNTER — Telehealth: Payer: Self-pay | Admitting: *Deleted

## 2025-01-30 ENCOUNTER — Encounter: Payer: Self-pay | Admitting: Family Medicine

## 2025-01-30 ENCOUNTER — Ambulatory Visit: Admitting: Family Medicine

## 2025-01-30 VITALS — BP 140/94 | HR 63 | Temp 97.6°F | Ht 62.0 in | Wt 195.4 lb

## 2025-01-30 DIAGNOSIS — R600 Localized edema: Secondary | ICD-10-CM | POA: Insufficient documentation

## 2025-01-30 DIAGNOSIS — L03115 Cellulitis of right lower limb: Secondary | ICD-10-CM | POA: Insufficient documentation

## 2025-01-30 DIAGNOSIS — I10 Essential (primary) hypertension: Secondary | ICD-10-CM

## 2025-01-30 DIAGNOSIS — R5383 Other fatigue: Secondary | ICD-10-CM | POA: Insufficient documentation

## 2025-01-30 LAB — COMPREHENSIVE METABOLIC PANEL WITH GFR
ALT: 8 U/L (ref 3–35)
AST: 22 U/L (ref 5–37)
Albumin: 4.1 g/dL (ref 3.5–5.2)
Alkaline Phosphatase: 95 U/L (ref 39–117)
BUN: 14 mg/dL (ref 6–23)
CO2: 35 meq/L — ABNORMAL HIGH (ref 19–32)
Calcium: 9.9 mg/dL (ref 8.4–10.5)
Chloride: 91 meq/L — ABNORMAL LOW (ref 96–112)
Creatinine, Ser: 0.85 mg/dL (ref 0.40–1.20)
GFR: 66.72 mL/min
Glucose, Bld: 107 mg/dL — ABNORMAL HIGH (ref 70–99)
Potassium: 2.9 meq/L — ABNORMAL LOW (ref 3.5–5.1)
Sodium: 130 meq/L — ABNORMAL LOW (ref 135–145)
Total Bilirubin: 0.9 mg/dL (ref 0.2–1.2)
Total Protein: 6.9 g/dL (ref 6.0–8.3)

## 2025-01-30 LAB — CBC WITH DIFFERENTIAL/PLATELET
Basophils Absolute: 0.1 10*3/uL (ref 0.0–0.1)
Basophils Relative: 0.7 % (ref 0.0–3.0)
Eosinophils Absolute: 0.2 10*3/uL (ref 0.0–0.7)
Eosinophils Relative: 1.8 % (ref 0.0–5.0)
HCT: 36.6 % (ref 36.0–46.0)
Hemoglobin: 12.3 g/dL (ref 12.0–15.0)
Lymphocytes Relative: 15.1 % (ref 12.0–46.0)
Lymphs Abs: 1.3 10*3/uL (ref 0.7–4.0)
MCHC: 33.7 g/dL (ref 30.0–36.0)
MCV: 82.3 fl (ref 78.0–100.0)
Monocytes Absolute: 0.7 10*3/uL (ref 0.1–1.0)
Monocytes Relative: 8 % (ref 3.0–12.0)
Neutro Abs: 6.6 10*3/uL (ref 1.4–7.7)
Neutrophils Relative %: 74.4 % (ref 43.0–77.0)
Platelets: 376 10*3/uL (ref 150.0–400.0)
RBC: 4.45 Mil/uL (ref 3.87–5.11)
RDW: 13.7 % (ref 11.5–15.5)
WBC: 8.9 10*3/uL (ref 4.0–10.5)

## 2025-01-30 MED ORDER — DOXYCYCLINE HYCLATE 100 MG PO TABS: 100.0000 mg | ORAL_TABLET | Freq: Two times a day (BID) | ORAL | 0 refills | Status: AC

## 2025-01-30 MED ORDER — CEFTRIAXONE SODIUM 1 G IJ SOLR
1.0000 g | Freq: Once | INTRAMUSCULAR | Status: AC
  Administered 2025-01-30: 1 g via INTRAMUSCULAR

## 2025-01-30 NOTE — Assessment & Plan Note (Signed)
 Acute, given large bolus with possible purulent material, erythema and heat in bilateral feet there is some concern for new bacterial infection. Patient given Rocephin  1 g x 1 in office.  She will start doxycycline  100 mg twice daily x 10 days. She will follow-up closely early next week for reevaluation.  Return and ER precautions provided.  If fever on antibiotic or redness streaking up her leg she will go to the emergency room.

## 2025-01-30 NOTE — Assessment & Plan Note (Signed)
 Chronic, above goal in office today.  She has not been following at home recently.  She has been consistent with taking her atenolol /chlorthalidone  100/25 mg daily. Could be related to pain given shoulder break. She will follow at home with arm cuff and will call if persistently above 140/90.

## 2025-01-30 NOTE — Assessment & Plan Note (Signed)
 Acute, will evaluate with labs for secondary cause such as new liver, kidney or heart issue. May impart be related to venous insufficiency and recent decreased mobility after humeral fracture. She has been sitting 24 hours a day and has not had her feet elevated.  If kidney function in normal range will add Lasix .  Encouraged patient to elevate feet above heart is much as able.  She will hold off on compression hose for now given possible ongoing infection.  Given bilateral edema doubt DVT.  Also no calf soreness

## 2025-01-30 NOTE — Progress Notes (Signed)
 "   Patient ID: ALECHIA LEZAMA, female    DOB: May 17, 1951, 74 y.o.   MRN: [id number]  This visit was conducted in person.  BP (!) 140/94 (BP Location: Right Arm, Patient Position: Sitting, Cuff Size: Large)   Pulse 63   Temp 97.6 F (36.4 C) (Oral)   Ht 5' 2 (1.575 m)   Wt 195 lb 6.4 oz (88.6 kg)   SpO2 96%   BMI 35.74 kg/m    CC:  Chief Complaint  Patient presents with   Acute Visit    3 weeks since left broke arm now retaining fluid Bilateral feet swelling and weeping, mild redness, onset 1 week   Pt states she has never had this happen before    Subjective:   HPI: CYRIL RAILEY is a 74 y.o. female presenting on 01/30/2025 for Acute Visit (3 weeks since left broke arm/now retaining fluid/Bilateral feet swelling and weeping, mild redness, onset 1 week/ /Pt states she has never had this happen before)  Seen in the ED on January 10, 2025 after tripping on a toy and falling to hit her left shoulder. Found to have closed fracture of the head of the left humerus Treated with shoulder immobilizer and orthopedic follow-up.   No surgery.  Plan PT at 6 weeks.  Tylenol  off and on.. could not tolerated the oxycodone / tramadol  Using Nyquil at night to sleep.  In the last week she has noted swelling in her feet bilaterally, weeping and mild redness.  First noted swelling.. redness and blisters occurred on both feet.  She is more tired than usual.  Not doing any activity other than going the bathroom.  No fever.    History of obesity BMI 35  HTN BP Readings from Last 3 Encounters:  01/30/25 (!) 140/94  01/10/25 (!) 191/73  01/12/24 120/80   Wt Readings from Last 3 Encounters:  01/30/25 195 lb 6.4 oz (88.6 kg)  01/10/25 191 lb (86.6 kg)  01/12/24 190 lb 6 oz (86.4 kg)      Relevant past medical, surgical, family and social history reviewed and updated as indicated. Interim medical history since our last visit reviewed. Allergies and medications reviewed and  updated. Outpatient Medications Prior to Visit  Medication Sig Dispense Refill   Ascorbic Acid (VITAMIN C PO) Take 1 tablet by mouth daily.     atenolol -chlorthalidone  (TENORETIC ) 100-25 MG tablet TAKE 1 TABLET BY MOUTH EVERY DAY 90 tablet 0   cholecalciferol (VITAMIN D ) 400 UNITS TABS tablet Take 400 Units by mouth 2 (two) times daily.     Glucosamine Sulfate 1000 MG TABS Take 1 tablet by mouth daily.     Multiple Vitamin (MULTIVITAMIN) tablet Take 1 tablet by mouth daily.     Omega-3 Fatty Acids (FISH OIL) 1000 MG CAPS Take 1 capsule by mouth daily.     Red Yeast Rice 600 MG CAPS Take 600 mg by mouth 2 (two) times daily.     oxyCODONE -acetaminophen  (PERCOCET) 5-325 MG tablet Take 1 tablet by mouth every 4 (four) hours as needed. (Patient not taking: Reported on 01/30/2025) 12 tablet 0   No facility-administered medications prior to visit.     Per HPI unless specifically indicated in ROS section below Review of Systems  Constitutional:  Negative for fatigue and fever.  HENT:  Negative for congestion.   Eyes:  Negative for pain.  Respiratory:  Negative for cough and shortness of breath.   Cardiovascular:  Positive for leg swelling. Negative for chest  pain and palpitations.  Gastrointestinal:  Negative for abdominal pain.  Genitourinary:  Negative for dysuria and vaginal bleeding.  Musculoskeletal:  Negative for back pain.  Neurological:  Negative for syncope, light-headedness and headaches.  Psychiatric/Behavioral:  Negative for dysphoric mood.    Objective:  BP (!) 140/94 (BP Location: Right Arm, Patient Position: Sitting, Cuff Size: Large)   Pulse 63   Temp 97.6 F (36.4 C) (Oral)   Ht 5' 2 (1.575 m)   Wt 195 lb 6.4 oz (88.6 kg)   SpO2 96%   BMI 35.74 kg/m   Wt Readings from Last 3 Encounters:  01/30/25 195 lb 6.4 oz (88.6 kg)  01/10/25 191 lb (86.6 kg)  01/12/24 190 lb 6 oz (86.4 kg)      Physical Exam Constitutional:      General: She is not in acute distress.     Appearance: Normal appearance. She is well-developed. She is not ill-appearing or toxic-appearing.  HENT:     Head: Normocephalic.     Right Ear: Hearing, tympanic membrane, ear canal and external ear normal. Tympanic membrane is not erythematous, retracted or bulging.     Left Ear: Hearing, tympanic membrane, ear canal and external ear normal. Tympanic membrane is not erythematous, retracted or bulging.     Nose: No mucosal edema or rhinorrhea.     Right Sinus: No maxillary sinus tenderness or frontal sinus tenderness.     Left Sinus: No maxillary sinus tenderness or frontal sinus tenderness.     Mouth/Throat:     Pharynx: Uvula midline.  Eyes:     General: Lids are normal. Lids are everted, no foreign bodies appreciated.     Conjunctiva/sclera: Conjunctivae normal.     Pupils: Pupils are equal, round, and reactive to light.  Neck:     Thyroid: No thyroid mass or thyromegaly.     Vascular: No carotid bruit.     Trachea: Trachea normal.  Cardiovascular:     Rate and Rhythm: Normal rate and regular rhythm.     Pulses: Normal pulses.          Dorsalis pedis pulses are 2+ on the right side and 2+ on the left side.       Posterior tibial pulses are 2+ on the right side and 2+ on the left side.     Heart sounds: Normal heart sounds, S1 normal and S2 normal. No murmur heard.    No friction rub. No gallop.  Pulmonary:     Effort: Pulmonary effort is normal. No tachypnea or respiratory distress.     Breath sounds: Normal breath sounds. No decreased breath sounds, wheezing, rhonchi or rales.  Abdominal:     General: Bowel sounds are normal.     Palpations: Abdomen is soft.     Tenderness: There is no abdominal tenderness.  Musculoskeletal:     Cervical back: Normal range of motion and neck supple.     Right lower leg: 2+ Pitting Edema present.     Left lower leg: 2+ Pitting Edema present.       Feet:  Feet:     Comments: Erythema and warmth on bilateral feet from toes to midfoot, bulla  with possible purulent fluid on bilateral dorsal feet Skin:    General: Skin is warm and dry.     Findings: No rash.  Neurological:     Mental Status: She is alert.  Psychiatric:        Mood and Affect: Mood is not anxious or  depressed.        Speech: Speech normal.        Behavior: Behavior normal. Behavior is cooperative.        Thought Content: Thought content normal.        Judgment: Judgment normal.       Results for orders placed or performed in visit on 12/07/23  Lipid panel   Collection Time: 12/07/23  8:15 AM  Result Value Ref Range   Cholesterol 200 0 - 200 mg/dL   Triglycerides 792.9 (H) 0.0 - 149.0 mg/dL   HDL 55.69 >60.99 mg/dL   VLDL 58.5 (H) 0.0 - 59.9 mg/dL   LDL Cholesterol 885 (H) 0 - 99 mg/dL   Total CHOL/HDL Ratio 5    NonHDL 155.62   Hemoglobin A1c   Collection Time: 12/07/23  8:15 AM  Result Value Ref Range   Hgb A1c MFr Bld 5.8 4.6 - 6.5 %  Comprehensive metabolic panel   Collection Time: 12/07/23  8:15 AM  Result Value Ref Range   Sodium 138 135 - 145 mEq/L   Potassium 3.6 3.5 - 5.1 mEq/L   Chloride 98 96 - 112 mEq/L   CO2 31 19 - 32 mEq/L   Glucose, Bld 108 (H) 70 - 99 mg/dL   BUN 19 6 - 23 mg/dL   Creatinine, Ser 8.92 0.40 - 1.20 mg/dL   Total Bilirubin 0.7 0.2 - 1.2 mg/dL   Alkaline Phosphatase 70 39 - 117 U/L   AST 19 0 - 37 U/L   ALT 9 0 - 35 U/L   Total Protein 7.1 6.0 - 8.3 g/dL   Albumin 4.3 3.5 - 5.2 g/dL   GFR 48.96 (L) >39.99 mL/min   Calcium  9.6 8.4 - 10.5 mg/dL    Assessment and Plan  Peripheral edema Assessment & Plan: Acute, will evaluate with labs for secondary cause such as new liver, kidney or heart issue. May impart be related to venous insufficiency and recent decreased mobility after humeral fracture. She has been sitting 24 hours a day and has not had her feet elevated.  If kidney function in normal range will add Lasix .  Encouraged patient to elevate feet above heart is much as able.  She will hold off on  compression hose for now given possible ongoing infection.  Given bilateral edema doubt DVT.  Also no calf soreness  Orders: -     Comprehensive metabolic panel with GFR -     CBC with Differential/Platelet -     Pro b natriuretic peptide (BNP)  Cellulitis of both feet Assessment & Plan: Acute, given large bolus with possible purulent material, erythema and heat in bilateral feet there is some concern for new bacterial infection. Patient given Rocephin  1 g x 1 in office.  She will start doxycycline  100 mg twice daily x 10 days. She will follow-up closely early next week for reevaluation.  Return and ER precautions provided.  If fever on antibiotic or redness streaking up her leg she will go to the emergency room.  Orders: -     cefTRIAXone  Sodium  Other fatigue  Essential hypertension Assessment & Plan: Chronic, above goal in office today.  She has not been following at home recently.  She has been consistent with taking her atenolol /chlorthalidone  100/25 mg daily. Could be related to pain given shoulder break. She will follow at home with arm cuff and will call if persistently above 140/90.   Other orders -     Doxycycline  Hyclate; Take  1 tablet (100 mg total) by mouth 2 (two) times daily.  Dispense: 20 tablet; Refill: 0    Return in about 5 days (around 02/04/2025) for  follow up feet.   Greig Ring, MD  "

## 2025-01-30 NOTE — Telephone Encounter (Signed)
 Copied from CRM [id number]. Topic: Clinical - Prescription Issue >> Jan 30, 2025 12:21 PM Rexford HERO wrote: Reason for CRM: Patient and husband calling in because they thought Dr. Avelina was going  to call in a diuretic along with the antibiotic. When they went to the Pharmacy, the antibiotic was the only medication at the pharmacy. Please advise.  Call back # [phone]

## 2025-01-30 NOTE — Telephone Encounter (Signed)
 Mr. Medero notified that we are waiting on patient's lab results to make sure her kidney functions are normal before sending in the diuretic.

## 2025-01-31 LAB — PRO B NATRIURETIC PEPTIDE: NT-Pro BNP: 141 pg/mL (ref 0–738)

## 2025-01-31 MED ORDER — FUROSEMIDE 20 MG PO TABS: 20.0000 mg | ORAL_TABLET | Freq: Every day | ORAL | 0 refills | Status: AC

## 2025-01-31 MED ORDER — POTASSIUM CHLORIDE CRYS ER 20 MEQ PO TBCR: 40.0000 meq | EXTENDED_RELEASE_TABLET | Freq: Every day | ORAL | 0 refills | Status: AC

## 2025-02-04 ENCOUNTER — Ambulatory Visit: Admitting: Family Medicine

## 2025-02-06 ENCOUNTER — Other Ambulatory Visit

## 2025-02-13 ENCOUNTER — Encounter: Admitting: Family Medicine

## 2025-02-13 ENCOUNTER — Ambulatory Visit
